# Patient Record
Sex: Female | Born: 1980 | Race: White | Hispanic: No | Marital: Single | Smoking: Never smoker
Health system: Southern US, Community
[De-identification: ages and names within clinical notes are randomized; demographics above are authoritative.]

## PROBLEM LIST (undated history)

## (undated) DIAGNOSIS — J45909 Unspecified asthma, uncomplicated: Secondary | ICD-10-CM

## (undated) DIAGNOSIS — K219 Gastro-esophageal reflux disease without esophagitis: Secondary | ICD-10-CM

## (undated) DIAGNOSIS — M249 Joint derangement, unspecified: Secondary | ICD-10-CM

## (undated) DIAGNOSIS — G43829 Menstrual migraine, not intractable, without status migrainosus: Secondary | ICD-10-CM

## (undated) HISTORY — DX: Unspecified asthma, uncomplicated: J45.909

## (undated) HISTORY — DX: Gastro-esophageal reflux disease without esophagitis: K21.9

## (undated) HISTORY — PX: MOLE REMOVAL: SHX2046

## (undated) HISTORY — DX: Menstrual migraine, not intractable, without status migrainosus: G43.829

## (undated) HISTORY — DX: Joint derangement, unspecified: M24.9

---
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
## 2003-01-09 ENCOUNTER — Emergency Department (HOSPITAL_COMMUNITY): Admission: EM | Admit: 2003-01-09 | Discharge: 2003-01-09 | Payer: Self-pay | Admitting: Emergency Medicine

## 2004-12-05 ENCOUNTER — Other Ambulatory Visit: Admission: RE | Admit: 2004-12-05 | Discharge: 2004-12-05 | Payer: Self-pay | Admitting: Obstetrics and Gynecology

## 2004-12-11 ENCOUNTER — Encounter: Admission: RE | Admit: 2004-12-11 | Discharge: 2004-12-11 | Payer: Self-pay | Admitting: Allergy and Immunology

## 2005-03-07 ENCOUNTER — Ambulatory Visit: Payer: Self-pay | Admitting: Infectious Diseases

## 2006-01-08 ENCOUNTER — Other Ambulatory Visit: Admission: RE | Admit: 2006-01-08 | Discharge: 2006-01-08 | Payer: Self-pay | Admitting: Obstetrics and Gynecology

## 2007-02-05 DIAGNOSIS — J45909 Unspecified asthma, uncomplicated: Secondary | ICD-10-CM

## 2007-02-05 HISTORY — DX: Unspecified asthma, uncomplicated: J45.909

## 2007-02-17 ENCOUNTER — Other Ambulatory Visit: Admission: RE | Admit: 2007-02-17 | Discharge: 2007-02-17 | Payer: Self-pay | Admitting: Obstetrics & Gynecology

## 2008-01-12 ENCOUNTER — Other Ambulatory Visit: Admission: RE | Admit: 2008-01-12 | Discharge: 2008-01-12 | Payer: Self-pay | Admitting: Obstetrics and Gynecology

## 2009-01-13 ENCOUNTER — Other Ambulatory Visit: Admission: RE | Admit: 2009-01-13 | Discharge: 2009-01-13 | Payer: Self-pay | Admitting: Obstetrics & Gynecology

## 2011-06-27 ENCOUNTER — Ambulatory Visit (INDEPENDENT_AMBULATORY_CARE_PROVIDER_SITE_OTHER): Payer: 59 | Admitting: Sports Medicine

## 2011-06-27 ENCOUNTER — Encounter: Payer: Self-pay | Admitting: Sports Medicine

## 2011-06-27 VITALS — BP 122/81 | HR 84 | Ht 64.0 in | Wt 180.0 lb

## 2011-06-27 DIAGNOSIS — M6530 Trigger finger, unspecified finger: Secondary | ICD-10-CM

## 2011-06-27 DIAGNOSIS — M25539 Pain in unspecified wrist: Secondary | ICD-10-CM

## 2011-06-27 DIAGNOSIS — M357 Hypermobility syndrome: Secondary | ICD-10-CM

## 2011-06-27 DIAGNOSIS — M25531 Pain in right wrist: Secondary | ICD-10-CM | POA: Insufficient documentation

## 2011-06-27 MED ORDER — KETOPROFEN POWD: Status: DC

## 2011-06-27 NOTE — Assessment & Plan Note (Signed)
We will use a wrist loop for all her computer activities over the next 6 weeks  Use topical ketoprofen gel for inflammation  Wrist rehabilitation exercises given

## 2011-06-27 NOTE — Assessment & Plan Note (Signed)
Continue to use warm baths and squeezing a rubber ball to keep motion

## 2011-06-27 NOTE — Assessment & Plan Note (Signed)
I think this is the key factor that caused her to have injuries to her right upper extremity into her ankles  Work on strength activities with light weights to help stabilize the joint

## 2011-06-27 NOTE — Progress Notes (Signed)
  Subjective:    Patient ID: Toni Monroe, female    DOB: 05-06-1981, 30 y.o.   MRN: [id number]  HPI  Pt presents to clinic for evaluation of mid anterior right wrist and lateral elbow pain which started 02/2011. 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  Only has pain during work week, none on weekends. Wrist pain is the key symptom that bothers her most.  She also gets a trigger finger on the right third.  Training for half marathon now - last year had 3rd deg ankle sprain while training. Uses kinesio tape on rt wrist and elbow- which helps pain. Takes Advil prn which helps.    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, bilat knees Brighten score 5  No pain with book test Wrist motion on rt increased radial and ulnar deviation, not on lt Wrist extension bilat 90 deg, flexion 80 deg on rt, and 85 deg on lt Phalen's test neg Tenderness over rt lat epicondyle Resisted wrist extension causes pain in rt elbow Slight pain with extension of rt 3rd finger Rt shoulder is hypermobile- able to touch both hands behind back Surgical scar along anterior wrist wrist approx 2 inches long  [facility] ultrasound Right lateral epicondyle is visualized and appears normal Right wrist ischemia and in the fourth compartment reveals edema Other compartments of the wrist are normal Median nerve normal Right third finger reveals thickening of the flexor tendon sheath but no nodule     Assessment & Plan:

## 2011-06-27 NOTE — Patient Instructions (Signed)
Use wrist loop for typing and exercise for next 6 weeks  Start wrist exercises - Downward curl- down slow then up fast Over slow back fast (use light weight dumbbell)   Tricep extensions and bicep curls for elbow   Internal and external rotation and spokes of a wheel exercises for shoulder (do not go behind you with spokes of wheel)  Please follow in in 6 weeks

## 2011-08-12 ENCOUNTER — Encounter: Payer: Self-pay | Admitting: Sports Medicine

## 2011-08-12 ENCOUNTER — Ambulatory Visit (INDEPENDENT_AMBULATORY_CARE_PROVIDER_SITE_OTHER): Payer: 59 | Admitting: Sports Medicine

## 2011-08-12 DIAGNOSIS — M25539 Pain in unspecified wrist: Secondary | ICD-10-CM

## 2011-08-12 DIAGNOSIS — M357 Hypermobility syndrome: Secondary | ICD-10-CM

## 2011-08-12 DIAGNOSIS — M25531 Pain in right wrist: Secondary | ICD-10-CM

## 2011-08-12 DIAGNOSIS — M6530 Trigger finger, unspecified finger: Secondary | ICD-10-CM

## 2011-08-12 NOTE — Assessment & Plan Note (Signed)
Much improved Will continue w exercises Prn use of loop and ketoprofen

## 2011-08-12 NOTE — Patient Instructions (Addendum)
Ok to use ketoprofen gel and wrist loop brace if wrist is bothering you  Continue working with your trainer - keeping joints in a protected position  Follow up as needed   Have a great time in [place]!

## 2011-08-12 NOTE — Assessment & Plan Note (Signed)
Work with Systems analyst on safe positions for exercise  No hyperextension of joints

## 2011-08-12 NOTE — Assessment & Plan Note (Signed)
Cont motion and warm water soaks follow

## 2011-08-12 NOTE — Progress Notes (Signed)
  Subjective:    Patient ID: Toni Monroe, female    DOB: 1981-05-12, 30 y.o.   MRN: [id number]  HPI  Pt presents to clinic for f/u of rt wrist pain which she reports is 90% improved.   She has been using ketoprofen regularly.  Reports getting poison oak since last visit, and was not able to use wrist loop for a month. She also worked with her Systems analyst on upper body strengthening.  Changes positions when she has discomfort with typing at work, which is helpful.  Rt 3rd trigger finger has resolved.   Continues with wrist and hand exercises as described  Review of Systems     Objective:   Physical Exam  Beighton score of 5 Empty can negative bilat Slight discomfort with hawkins on the rt  Neer's negative on rt  No scapular winging bilat No swelling or tenderness of rt wrist No triggering or nodule rt 3rd finger      Assessment & Plan:

## 2011-09-16 ENCOUNTER — Other Ambulatory Visit: Payer: Self-pay | Admitting: *Deleted

## 2011-09-16 MED ORDER — DICLOFENAC SODIUM 1 % TD GEL: TRANSDERMAL | Status: DC

## 2012-01-20 LAB — HM PAP SMEAR: HM Pap smear: NEGATIVE

## 2012-10-09 ENCOUNTER — Ambulatory Visit (INDEPENDENT_AMBULATORY_CARE_PROVIDER_SITE_OTHER): Payer: 59 | Admitting: Sports Medicine

## 2012-10-09 VITALS — BP 132/89 | Ht 64.0 in | Wt 182.0 lb

## 2012-10-09 DIAGNOSIS — M25371 Other instability, right ankle: Secondary | ICD-10-CM | POA: Insufficient documentation

## 2012-10-09 DIAGNOSIS — M24873 Other specific joint derangements of unspecified ankle, not elsewhere classified: Secondary | ICD-10-CM

## 2012-10-09 DIAGNOSIS — M24876 Other specific joint derangements of unspecified foot, not elsewhere classified: Secondary | ICD-10-CM

## 2012-10-09 NOTE — Progress Notes (Signed)
Chief complaint right ankle pain calf pain  History of present illness: Patient is a 32 year old female who is coming in with complaint of right ankle and calf pain. Patient has a past medical history significant for multiple ankle sprains of the right ankle. Patient states that she discovered or any new injury but with her training recently she's been having increasing pain. Patient has been using kiniseotape which has been somewhat beneficial. Patient has not been wearing any ankle braces. Patient has not been taking any medications. Patient denies that this is stop her from any of her regular activities but anytime she tries to increase her training regimen she starts having more pain. Patient has been working with a Systems analyst for ankle stability which has helped but at the moment she has to back off secondary to the pain.  Patient is known history of hypermobility syndrome.  No changes in past medical, surgical, and family history.  Review of systems: 14 system review is done and unremarkable as related to the chief complaint.  Physical exam Blood pressure 132/89, height 5\' 4"  (1.626 m), weight 182 lb (82.555 kg). General: No apparent distress alert and oriented x3 mood and affect somewhat normal but blunted. Respiratory: Patient's speak in full sentences and does not appear short of breath Skin: Warm dry intact with no signs of infection or rash Neuro: Cranial nerves II through XII are intact, neurovascularly intact in all extremities with 2+ DTRs and 2+ pulses. Right ankle exam: Patient does have a mild positive anterior translation. She does have mild effusion over the ATFL. She is nontender on exam. Achilles is nontender on exam. Patient has full range of motion of the ankle actively in 5 out of 5 strength. Patient's calf is soft nontender with no erythema. Patient does have kinesiology tape in place. On standing patient does have overpronation of the hindfoot and splaying of the  first and second toes bilaterally. Patient has breakdown of the transverse arch bilaterally.  Patient was fitted in sports insoles with a medial heel wedge bilaterally as well as metatarsal pad bilaterally. Patient did walk in more neutral position.

## 2012-10-09 NOTE — Assessment & Plan Note (Signed)
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 Patient was given some temporary sports insoles with corrections as noted above. This likely will improve over the course of time. Patient was also given a ankle brace/compression sleeve which I think will help her with her training. Patient was given exercises that she can implement into her training. In addition to this patient was given hip abductor and core strengthening exercises as well. She will return again in 4 weeks for further evaluation.

## 2013-01-19 ENCOUNTER — Encounter: Payer: Self-pay | Admitting: *Deleted

## 2013-01-20 ENCOUNTER — Encounter: Payer: Self-pay | Admitting: Certified Nurse Midwife

## 2013-01-20 ENCOUNTER — Ambulatory Visit (INDEPENDENT_AMBULATORY_CARE_PROVIDER_SITE_OTHER): Payer: 59 | Admitting: Certified Nurse Midwife

## 2013-01-20 VITALS — BP 110/70 | Ht 64.25 in | Wt 191.0 lb

## 2013-01-20 DIAGNOSIS — Z01419 Encounter for gynecological examination (general) (routine) without abnormal findings: Secondary | ICD-10-CM

## 2013-01-20 MED ORDER — NORETHIN ACE-ETH ESTRAD-FE 1-20 MG-MCG PO TABS: 1.0000 | ORAL_TABLET | Freq: Every day | ORAL | Status: DC

## 2013-01-20 NOTE — Patient Instructions (Signed)

## 2013-01-20 NOTE — Progress Notes (Signed)
32 y.o. Single Caucasian female   G0P0000 here for annual exam.  Periods normal no issues.  Recent removal of two moles, all atypical but no cancer.  Asthma better control with running as exercise.  Contraception: Junel 1/20 Fe  working well with continous use with cycle every 3 months. Working well for menstrual migraines. No health issues today.  Patient's last menstrual period was 10/07/2012.          Sexually active: no  The current method of family planning is OCP (estrogen/progesterone).    Exercising: yes  running & weights Last mammogram: none Last pap: 01-20-12 neg Last BMD: none Alcohol: 7 a week Tobacco: none Colonoscopy: none  Health Maintenance  Topic Date Due  . Tetanus/tdap  08/26/2000  . Influenza Vaccine  06/07/2013  . Pap Smear  01/20/2015    Family History  Problem Relation Age of Onset  . Cancer Mother     lung  . Hyperlipidemia Mother   . Hypertension Father   . Glaucoma Father   . Cancer Maternal Grandmother     lymphatic  . Glaucoma Paternal Grandmother   . Hypertension Paternal Grandmother   . Diabetes Paternal Grandfather   . Hypertension Paternal Grandfather   . Heart disease Paternal Grandfather     heart attack  . Cancer Paternal Grandfather     melanoma  . Heart disease Maternal Aunt     heart attack under age 8    Patient Active Problem List  Diagnosis  . Wrist pain, right  . Right trigger finger  . Hypermobility syndrome  . Right ankle instability    Past Medical History  Diagnosis Date  . GERD (gastroesophageal reflux disease)   . Asthma 02/2007    allergys  . Menstrual migraine     and aura    Past Surgical History  Procedure Laterality Date  . Birth mark removal  1986  . Mole removal      Allergies: Ketoprofen  Current Outpatient Prescriptions  Medication Sig Dispense Refill  . cetirizine (ZYRTEC) 10 MG tablet Take 10 mg by mouth daily.        Marland Kitchen ibuprofen (ADVIL,MOTRIN) 100 MG tablet Take 100 mg by mouth as needed  for fever.      . Multiple Vitamin (MULTIVITAMIN) tablet Take 1 tablet by mouth daily.        . Norethin Ace-Eth Estrad-FE (LOESTRIN FE 1/20 PO) Take by mouth daily. Takes continuously      . SINGULAIR 10 MG tablet Take 10 mg by mouth daily.      . VENTOLIN HFA 108 (90 BASE) MCG/ACT inhaler Inhale 2 puffs into the lungs as needed.       No current facility-administered medications for this visit.    ROS: A comprehensive review of systems was negative.  Exam:    BP 110/70  Ht 5' 4.25" (1.632 m)  Wt 191 lb (86.637 kg)  BMI 32.53 kg/m2  LMP 10/07/2012 Weight change: @WEIGHTCHANGE @ Last 3 height recordings:  Ht Readings from Last 3 Encounters:  01/20/13 5' 4.25" (1.632 m)  10/09/12 5\' 4"  (1.626 m)  06/27/11 5\' 4"  (1.626 m)   General appearance: alert and cooperative Head: Normocephalic, without obvious abnormality, atraumatic Neck: no adenopathy, supple, symmetrical, trachea midline and thyroid not enlarged, symmetric, no tenderness/mass/nodules Lungs: clear to auscultation bilaterally Breasts: normal appearance, no masses or tenderness, No nipple retraction or dimpling Heart: regular rate and rhythm Abdomen: soft, non-tender; bowel sounds normal; no masses,  no organomegaly Extremities: extremities  normal, atraumatic, no cyanosis or edema Skin: Skin color, texture, turgor normal. No rashes or lesions Lymph nodes: Cervical, supraclavicular, and axillary nodes normal. no inguinal nodes palpated Neurologic: Alert and oriented X 3, normal strength and tone. Normal symmetric reflexes. Normal coordination and gait   Pelvic: External genitalia:  no lesions              Urethra: normal appearing urethra with no masses, tenderness or lesions              Bartholins and Skenes: Bartholin's, Urethra, Skene's normal                 Vagina: normal appearing vagina with normal color and discharge, no lesions              Cervix: normal appearance              Pap taken: no        Bimanual  Exam:  Uterus:  uterus is normal size, shape, consistency and nontender                                      Adnexa:    normal adnexa in size, nontender and no masses                                      Rectovaginal: Confirms                                      Anus:  normal sphincter tone, no lesions  A:Well woman exam Contraception: Junel 1/20Fe continuous use for menstrual migraine control Asthma well controlled PCP manages     P:Reveiwed health and wellness pertinent to exam  pap smear as indicated Rx Junel1/20 Fe see order Continue follow up as indicated  return annually or prn      An After Visit Summary was printed and given to the patient.  Reviewed, TL

## 2013-01-26 ENCOUNTER — Ambulatory Visit: Payer: 59 | Admitting: Certified Nurse Midwife

## 2013-11-05 ENCOUNTER — Ambulatory Visit (INDEPENDENT_AMBULATORY_CARE_PROVIDER_SITE_OTHER): Payer: 59 | Admitting: Sports Medicine

## 2013-11-05 ENCOUNTER — Encounter: Payer: Self-pay | Admitting: Sports Medicine

## 2013-11-05 VITALS — BP 138/90 | Ht 64.0 in | Wt 190.0 lb

## 2013-11-05 DIAGNOSIS — M2170 Unequal limb length (acquired), unspecified site: Secondary | ICD-10-CM

## 2013-11-05 DIAGNOSIS — M549 Dorsalgia, unspecified: Secondary | ICD-10-CM | POA: Insufficient documentation

## 2013-11-05 NOTE — Progress Notes (Signed)
Patient ID: Toni Monroe, female   DOB: [birth year], 33 y.o.   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 Onset approximately one year ago. Pain is intermittent, it is exacerbated by running. She acute onset of pain approximately 8 miles into her runs.  Pain gets severe enough that it decreases to run to walk. The pain is also exacerbated by certain weight lifting regimens. She denies any specific injury. She does a remote history of ankle sprain on the right as well as hypermobility syndrome. She wears supportive inserts in her running shoes and complains of no foot pain currently. She has no knee pain or hip pain.  Pertinent past medical history hypermobility syndrome and right ankle instability  Social history: Occasional alcohol nonsmoker  Review of systems as per history of present illness otherwise negative  Examination: BP 138/90  Ht 5\' 4"  (1.626 m)  Wt 190 lb (86.183 kg)  BMI 32.60 kg/m2 Well-developed well-nourished 33 year old white female awake alert and oriented in no acute distress  Back tenderness to palpation noted in the right mid lumbar paraspinous musculature region. No SI joint tenderness or instability.  No vertebral point tenderness  Hips: Hip abduction strength 5/5, 5/5 for flexion extension and adduction as well  Leg length: 1 cm shorter left than right  Gait: She is slight outward bowing of the right foot. In addition she has a slight Trendelenburg of the left hip.

## 2013-11-05 NOTE — Assessment & Plan Note (Signed)
New inserts are made today. 3/16 heel lift was added to the left leg to compensate for leg length discrepancy.

## 2013-11-05 NOTE — Assessment & Plan Note (Addendum)
Patient given a prescription followup with Toni SiaJohn O'Halloran for formal physical therapy.  In addition compensation for leg length discrepancy may help improve her low back pain. She was also given line drills to do to help improve her running forming gait. She'll followup in one month.

## 2013-12-22 ENCOUNTER — Ambulatory Visit (INDEPENDENT_AMBULATORY_CARE_PROVIDER_SITE_OTHER): Payer: 59 | Admitting: Sports Medicine

## 2013-12-22 ENCOUNTER — Encounter: Payer: Self-pay | Admitting: Sports Medicine

## 2013-12-22 VITALS — BP 134/87 | Ht 64.0 in | Wt 190.0 lb

## 2013-12-22 DIAGNOSIS — M549 Dorsalgia, unspecified: Secondary | ICD-10-CM

## 2013-12-22 NOTE — Progress Notes (Addendum)
   Subjective:    Patient ID: Toni Monroe, female    DOB: 1981-01-05, 33 y.o.   MRN: [id number]  HPI  Ms. Bradstreet is here for follow-up on lower back pain which occurs primarily with running long distances. Since her last appointment she has been to PT with Jonny RuizJohn O'Halloran 2-3 times. She was given exercises to do to strengthen her back and improve right sided stability. She has been doing those exercises at home. She also started working with a Systems analystpersonal trainer who has provided her with more back strengthening work-outs. She has only had one run of 6 miles since her last appointment and did not experience any back pain. When she works out with the Systems analystpersonal trainer on her back she experiences a mild form of the back pain which is improved with 800 MG of Ibuprofen.   She has also been working on improving her running gait. And was given a heel lift during her last appointment. She has noticed that she is getting right-sided calf pain during long runs. She wears calf sleeves.   Denies tingling, numbness, or pain shooting down her leg.    Review of Systems Negative apart from HPI     Objective:   Physical Exam  Back Inspection: symmetric Palpation: no tenderness to palpation along vertebral column or Paraspinous muscles ROM: Full flexion and extension without back pain No tenderness to palpation along either calf. No calf swelling.  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 Low Back pain: improved --Continue to see John O'Halloran prn and continue to do PT exercises --Continue to work on corrected running gait --Follow-up PRN   Seen with Howell RucksJessica Rein, MS4

## 2014-01-04 ENCOUNTER — Other Ambulatory Visit: Payer: Self-pay | Admitting: Certified Nurse Midwife

## 2014-01-04 NOTE — Telephone Encounter (Signed)
AEX scheduled for 02/02/15 with Ms. [name]  Last AEX 01/19/13 rf's x 1 year was sent to pharmacy Junel 1/20 #1 pack with 0 refills sent to last patient until AEX

## 2014-01-28 ENCOUNTER — Telehealth: Payer: Self-pay | Admitting: Certified Nurse Midwife

## 2014-01-28 NOTE — Telephone Encounter (Signed)
Pt reshceduled

## 2014-01-28 NOTE — Telephone Encounter (Signed)
lmtcb re cx'd AEX with DL for 01/14/80--XBJYNW4/28/15--please RS patient.

## 2014-02-01 ENCOUNTER — Ambulatory Visit: Payer: 59 | Admitting: Certified Nurse Midwife

## 2014-02-07 ENCOUNTER — Other Ambulatory Visit: Payer: Self-pay | Admitting: Certified Nurse Midwife

## 2014-02-07 NOTE — Telephone Encounter (Signed)
eScribe request from [facility] for refill on JUNEL FE 1/20 Last filled - 01/04/14 X 1 PACK Last AEX - 01/20/13 Next AEX - 03/18/14 2 packs sent to last until AEX.

## 2014-03-18 ENCOUNTER — Encounter: Payer: Self-pay | Admitting: Certified Nurse Midwife

## 2014-03-18 ENCOUNTER — Ambulatory Visit (INDEPENDENT_AMBULATORY_CARE_PROVIDER_SITE_OTHER): Payer: 59 | Admitting: Certified Nurse Midwife

## 2014-03-18 VITALS — BP 120/84 | HR 70 | Resp 16 | Ht 64.25 in | Wt 201.0 lb

## 2014-03-18 DIAGNOSIS — Z124 Encounter for screening for malignant neoplasm of cervix: Secondary | ICD-10-CM

## 2014-03-18 DIAGNOSIS — Z01419 Encounter for gynecological examination (general) (routine) without abnormal findings: Secondary | ICD-10-CM

## 2014-03-18 DIAGNOSIS — Z309 Encounter for contraceptive management, unspecified: Secondary | ICD-10-CM

## 2014-03-18 MED ORDER — NORETHINDRONE ACET-ETHINYL EST 1-20 MG-MCG PO TABS: 1.0000 | ORAL_TABLET | Freq: Every day | ORAL | Status: DC

## 2014-03-18 NOTE — Patient Instructions (Signed)

## 2014-03-18 NOTE — Progress Notes (Signed)
33 y.o. G0P0000 Single Caucasian Fe here for annual exam. Periods normal, no issues. OCP working well for menstrual migraine with continuous use. [place] Sees PCP for labs and medication management as needed. No health issues today. Planning weight loss with PCP management.  Patient's last menstrual period was 03/07/2014.          Sexually active: no  The current method of family planning is OCP (estrogen/progesterone).    Exercising: yes  cardio & weightlifting Smoker:  no  Health Maintenance: Pap:  01-20-12 neg MMG:  none Colonoscopy: none BMD:   none TDaP:  2008 Labs: none PCP Self breast exam: done occ   reports that she has never smoked. She has never used smokeless tobacco. She reports that she drinks about 4 ounces of alcohol per week. She reports that she does not use illicit drugs.  Past Medical History  Diagnosis Date  . GERD (gastroesophageal reflux disease)   . Asthma 02/2007    allergys  . Menstrual migraine     and aura    Past Surgical History  Procedure Laterality Date  . Birth mark removal  1986  . Mole removal      Current Outpatient Prescriptions  Medication Sig Dispense Refill  . cetirizine (ZYRTEC) 10 MG tablet Take 10 mg by mouth daily.        Marland Kitchen. EPIPEN 2-PAK 0.3 MG/0.3ML SOAJ injection as needed.      Marland Kitchen. ibuprofen (ADVIL,MOTRIN) 100 MG tablet Take 100 mg by mouth as needed for fever.      Colleen Can. JUNEL FE 1/20 1-20 MG-MCG tablet TAKE ONE TABLET BY MOUTH ONE TIME DAILY   28 tablet  1  . Multiple Vitamin (MULTIVITAMIN) tablet Take 1 tablet by mouth as needed.       Marland Kitchen. SINGULAIR 10 MG tablet Take 10 mg by mouth daily.      . VENTOLIN HFA 108 (90 BASE) MCG/ACT inhaler Inhale 2 puffs into the lungs as needed.       No current facility-administered medications for this visit.    Family History  Problem Relation Age of Onset  . Cancer Mother     lung  . Hyperlipidemia Mother   . Hypertension Father   . Glaucoma Father    . Cancer Maternal Grandmother     lymphatic  . Glaucoma Paternal Grandmother   . Hypertension Paternal Grandmother   . Diabetes Paternal Grandfather   . Hypertension Paternal Grandfather   . Heart disease Paternal Grandfather     heart attack  . Cancer Paternal Grandfather     melanoma  . Dementia Paternal Grandfather   . Heart disease Maternal Aunt     heart attack under age 33    ROS:  Pertinent items are noted in HPI.  Otherwise, a comprehensive ROS was negative.  Exam:   BP 120/84  Pulse 70  Resp 16  Ht 5' 4.25" (1.632 m)  Wt 201 lb (91.173 kg)  BMI 34.23 kg/m2  LMP 03/07/2014  Recheck of BP after sitting for 15 minutes 110/68Height: 5' 4.25" (163.2 cm)  Ht Readings from Last 3 Encounters:  03/18/14 5' 4.25" (1.632 m)  12/22/13 5\' 4"  (1.626 m)  11/05/13 5\' 4"  (1.626 m)    General appearance: alert, cooperative and appears stated age Head: Normocephalic, without obvious abnormality, atraumatic Neck: no adenopathy, supple, symmetrical, trachea midline and thyroid normal to inspection and palpation and non-palpable Lungs: clear to auscultation bilaterally Breasts: normal appearance, no  masses or tenderness, No nipple retraction or dimpling, No nipple discharge or bleeding, No axillary or supraclavicular adenopathy Heart: regular rate and rhythm Abdomen: soft, non-tender; no masses,  no organomegaly Extremities: extremities normal, atraumatic, no cyanosis or edema Skin: Skin color, texture, turgor normal. No rashes or lesions Lymph nodes: Cervical, supraclavicular, and axillary nodes normal. No abnormal inguinal nodes palpated Neurologic: Grossly normal   Pelvic: External genitalia:  no lesions              Urethra:  normal appearing urethra with no masses, tenderness or lesions              Bartholin's and Skene's: normal                 Vagina: normal appearing vagina with normal color and discharge, no lesions              Cervix: normal,non tender               Pap taken: yes Bimanual Exam:  Uterus:  normal size, contour, position, consistency, mobility, non-tender and retroverted              Adnexa: normal adnexa and no mass, fullness, tenderness               Rectovaginal: Confirms               Anus:  normal sphincter tone, no lesions  A:  Well Woman with normal exam  Contraception for cycle control and menstrual migraine OCP working well  Weight gain with training for half marathon  P:   Reviewed health and wellness pertinent to exam  Rx Microgestin(Junel 1/20) see order  Pap smear taken today with HPVHR  Plans working with PCP regarding weight management   counseled on STD prevention, HIV risk factors and prevention, use and side effects of OCP's, adequate intake of calcium and vitamin D, diet and exercise  return annually or prn  An After Visit Summary was printed and given to the patient.

## 2014-03-19 NOTE — Progress Notes (Signed)
Reviewed personally.  M. Suzanne Marae Cottrell, MD.  

## 2014-03-22 LAB — IPS PAP TEST WITH HPV

## 2014-03-23 ENCOUNTER — Telehealth: Payer: Self-pay | Admitting: Certified Nurse Midwife

## 2014-03-23 ENCOUNTER — Other Ambulatory Visit: Payer: Self-pay | Admitting: Certified Nurse Midwife

## 2014-03-23 DIAGNOSIS — Z309 Encounter for contraceptive management, unspecified: Secondary | ICD-10-CM

## 2014-03-23 MED ORDER — NORETHIN ACE-ETH ESTRAD-FE 1-20 MG-MCG PO TABS: 1.0000 | ORAL_TABLET | Freq: Every day | ORAL | Status: DC

## 2014-03-23 NOTE — Telephone Encounter (Signed)
Patient wants to change her birth control due to cost. Patient says her previous birth control was junel. Patient may want to return to this or a new birth control.

## 2014-03-23 NOTE — Telephone Encounter (Signed)
Toni Monroe CNM, patient was seen on 03/18/14 for AEX. Patient was switched from Junel Fe 1-20 to Microgestin 1-20. Patient would like to return to old OCP or new OCP that is cheaper. Patient takes OCP continuously and has a history of menstrual migraines. What do you recommend for patient?

## 2014-03-23 NOTE — Telephone Encounter (Signed)
Ok to switch back, order placed

## 2014-03-23 NOTE — Telephone Encounter (Signed)
Spoke with patient. Advised rx switched back to Junel Fe 1-20 and sent to pharmacy of choice. Patient agreeable and verbalizes understanding.  Routing to provider for final review. Patient agreeable to disposition. Will close encounter

## 2014-07-01 ENCOUNTER — Telehealth: Payer: Self-pay | Admitting: Certified Nurse Midwife

## 2014-07-01 NOTE — Telephone Encounter (Signed)
Pt says she need something for her insurance stating that she was seen for an aex.

## 2014-07-13 DIAGNOSIS — E669 Obesity, unspecified: Secondary | ICD-10-CM | POA: Insufficient documentation

## 2014-07-13 DIAGNOSIS — J3089 Other allergic rhinitis: Secondary | ICD-10-CM | POA: Insufficient documentation

## 2014-09-09 ENCOUNTER — Telehealth: Payer: Self-pay | Admitting: Certified Nurse Midwife

## 2014-09-09 NOTE — Telephone Encounter (Signed)
Spoke with patient. Patient states that when she went in for appointment with PCP was told she may have PCOS. Patient [facility] who she did not [facility] for. Patient would like to come in to see Verner Choleborah S. Leonard CNM to discuss PCOS. Offered appointment for 12/8 but patient declines due to work schedule.Appointment scheduled for 12/15 at 2:30pm. Patient is agreeable to date and time.  Routing to provider for final review. Patient agreeable to disposition. Will close encounter

## 2014-09-09 NOTE — Telephone Encounter (Signed)
Pt saw her GP Dr Ihor [facility]nodi @ [facility] and he told pt she may have signs of PCOS. [facility] who [facility] not [facility] for. She would like to speak to Ms Darcel BayleyLeonard about this.  Pt best: [phone]  bf

## 2014-09-20 ENCOUNTER — Encounter: Payer: Self-pay | Admitting: Certified Nurse Midwife

## 2014-09-20 ENCOUNTER — Ambulatory Visit (INDEPENDENT_AMBULATORY_CARE_PROVIDER_SITE_OTHER): Payer: BC Managed Care – PPO | Admitting: Certified Nurse Midwife

## 2014-09-20 VITALS — BP 146/98 | HR 86 | Ht 64.25 in | Wt 192.0 lb

## 2014-09-20 DIAGNOSIS — R635 Abnormal weight gain: Secondary | ICD-10-CM

## 2014-09-20 NOTE — Patient Instructions (Signed)
Polycystic Ovarian Syndrome  Polycystic ovarian syndrome (PCOS) is a common hormonal disorder among women of reproductive age. Most women with PCOS grow many small cysts on their ovaries. PCOS can cause problems with your periods and make it difficult to get pregnant. It can also cause an increased risk of miscarriage with pregnancy. If left untreated, PCOS can lead to serious health problems, such as diabetes and heart disease.  CAUSES  The cause of PCOS is not fully understood, but genetics may be a factor.  SIGNS AND SYMPTOMS    Infrequent or no menstrual periods.    Inability to get pregnant (infertility) because of not ovulating.    Increased growth of hair on the face, chest, stomach, back, thumbs, thighs, or toes.    Acne, oily skin, or dandruff.    Pelvic pain.    Weight gain or obesity, usually carrying extra weight around the waist.    Type 2 diabetes.    High cholesterol.    High blood pressure.    Female-pattern baldness or thinning hair.    Patches of thickened and dark brown or black skin on the neck, arms, breasts, or thighs.    Tiny excess flaps of skin (skin tags) in the armpits or neck area.    Excessive snoring and having breathing stop at times while asleep (sleep apnea).    Deepening of the voice.    Gestational diabetes when pregnant.   DIAGNOSIS   There is no single test to diagnose PCOS.    Your health care provider will:    Take a medical history.    Perform a pelvic exam.    Have ultrasonography done.    Check your female and female hormone levels.    Measure glucose or sugar levels in the blood.    Do other blood tests.    If you are producing too many female hormones, your health care provider will make sure it is from PCOS. At the physical exam, your health care provider will want to evaluate the areas of increased hair growth. Try to allow natural hair growth for a few days before the visit.    During a pelvic exam, the ovaries may be  enlarged or swollen because of the increased number of small cysts. This can be seen more easily by using vaginal ultrasonography or screening to examine the ovaries and lining of the uterus (endometrium) for cysts. The uterine lining may become thicker if you have not been having a regular period.   TREATMENT   Because there is no cure for PCOS, it needs to be managed to prevent problems. Treatments are based on your symptoms. Treatment is also based on whether you want to have a baby or whether you need contraception.   Treatment may include:    Progesterone hormone to start a menstrual period.    Birth control pills to make you have regular menstrual periods.    Medicines to make you ovulate, if you want to get pregnant.    Medicines to control your insulin.    Medicine to control your blood pressure.    Medicine and diet to control your high cholesterol and triglycerides in your blood.   Medicine to reduce excessive hair growth.   Surgery, making small holes in the ovary, to decrease the amount of female hormone production. This is done through a long, lighted tube (laparoscope) placed into the pelvis through a tiny incision in the lower abdomen.   HOME CARE INSTRUCTIONS   Only   take over-the-counter or prescription medicine as directed by your health care provider.   Pay attention to the foods you eat and your activity levels. This can help reduce the effects of PCOS.   Keep your weight under control.   Eat foods that are low in carbohydrate and high in fiber.   Exercise regularly.  SEEK MEDICAL CARE IF:   Your symptoms do not get better with medicine.   You have new symptoms.  Document Released: 01/17/2005 Document Revised: 07/14/2013 Document Reviewed: 03/11/2013  ExitCare Patient Information 2015 [employer]. This information is not intended to replace advice given to you by your health care provider. Make sure you discuss any questions you have with your health care provider.

## 2014-09-20 NOTE — Progress Notes (Signed)
33 y.o. Single Caucasian G0P0000here for evaluation of PCOS. Menses normal with continuous use Junel 1/20 fe with period every third cycle.LMP 10/15. Patient was seen by endocrine at [facility] and was told she does not have PCOS. Was given Phentermine for use with 12 pounds loss. She is working with Research scientist (life sciences)trainer and nutritionist. She feels this is helping. PCP thought she had PCOS due to weight gain. Denies excessive hair growth, irregular periods or problems with periods. Had Hgb A1-c which was 4.2,did not do TSH. She has cut down on alcohol intake, also. Would like to have thyroid level checked and testosterone level. She is aware other hormone testing can not be done while on active pills. " Here to discuss what our opinion is."  O: Healthy female, WD WN Affect: normal orientation X 3  BP  Recheck 120/70   A: History of weight gain and weight loss with good physical activity and diet over the past 6-7 years Recent [facility] and was told no PCOS, just weight problem. Phentermine use with weight loss. No specific character pattern for PCOS, but no PUS  P: Discussed patient her concerns with weight management. Patient works for the [employer] and has access to all the help she needs with nutrition and exercise. Discouraged that it takes so much work to lose weight and maintain loss. Had been running in 5 k and injured ankle last year and has been unable to do this, which did help with weight control. Discussed that PCOS does have weight, glucose issues usually, excessive hair growth, irregular cycles or amenorrhea. She does not have any of these, except for weight issues. Feel PUS not warranted at this time. Discussed not focusing on weight but healthy lifestyle and diet. Also discussed that nightly alcohol (1-2 drinks) increase calorie load, but no nutritional value. Patient feels better after discussion and questions answered. Will continue in current direction with life style changes.  Questions addressed. Patient will advise of progress.  Lab: TSH with  panel, Testosterone   35 minutes spent with patient  in face to face counseling regarding PCOS, weight management and healthy lifestyle.  RV prn

## 2014-09-21 LAB — THYROID PANEL WITH TSH
Free Thyroxine Index: 1.6 (ref 1.4–3.8)
T3 Uptake: 19 % — ABNORMAL LOW (ref 22–35)
T4, Total: 8.2 ug/dL (ref 4.5–12.0)
TSH: 2.79 u[IU]/mL (ref 0.350–4.500)

## 2014-09-21 LAB — TESTOSTERONE: TESTOSTERONE: 30 ng/dL (ref 10–70)

## 2014-09-23 NOTE — Progress Notes (Signed)
Reviewed personally.  M. Suzanne Ahilyn Nell, MD.  

## 2014-09-26 ENCOUNTER — Telehealth: Payer: Self-pay

## 2014-09-26 ENCOUNTER — Other Ambulatory Visit: Payer: Self-pay | Admitting: Certified Nurse Midwife

## 2014-09-26 DIAGNOSIS — R899 Unspecified abnormal finding in specimens from other organs, systems and tissues: Secondary | ICD-10-CM

## 2014-09-26 NOTE — Telephone Encounter (Signed)
-----   Message from Verner Choleborah S Leonard, CNM sent at 09/26/2014  8:47 AM EST ----- Notify patient that testosterone is normal Thyroid panel is normal except for slightly low T 3 no treatment indicated Recheck in 6 months order in

## 2014-09-26 NOTE — Telephone Encounter (Signed)
Lmtcb.

## 2014-09-27 NOTE — Telephone Encounter (Signed)
Patient notified of results as written by provider 

## 2014-09-27 NOTE — Telephone Encounter (Signed)
Pt returning call

## 2014-10-04 ENCOUNTER — Other Ambulatory Visit: Payer: Self-pay | Admitting: Certified Nurse Midwife

## 2014-10-06 ENCOUNTER — Other Ambulatory Visit: Payer: Self-pay | Admitting: Certified Nurse Midwife

## 2014-10-21 ENCOUNTER — Other Ambulatory Visit (INDEPENDENT_AMBULATORY_CARE_PROVIDER_SITE_OTHER): Payer: BLUE CROSS/BLUE SHIELD

## 2014-10-21 ENCOUNTER — Other Ambulatory Visit: Payer: Self-pay | Admitting: Certified Nurse Midwife

## 2014-10-21 DIAGNOSIS — R635 Abnormal weight gain: Secondary | ICD-10-CM

## 2014-10-21 DIAGNOSIS — R899 Unspecified abnormal finding in specimens from other organs, systems and tissues: Secondary | ICD-10-CM

## 2014-10-22 LAB — FSH/LH
FSH: 5.5 m[IU]/mL
LH: 0.9 m[IU]/mL

## 2014-10-22 LAB — ESTRADIOL: Estradiol: 27.3 pg/mL

## 2014-10-22 LAB — INSULIN, FASTING: INSULIN FASTING, SERUM: 6.2 u[IU]/mL (ref 2.0–19.6)

## 2014-10-22 LAB — THYROID PANEL WITH TSH
Free Thyroxine Index: 2 (ref 1.4–3.8)
T3 UPTAKE: 23 % (ref 22–35)
T4, Total: 8.6 ug/dL (ref 4.5–12.0)
TSH: 2.164 u[IU]/mL (ref 0.350–4.500)

## 2014-10-22 LAB — DHEA-SULFATE: DHEA SO4: 60 ug/dL (ref 23–266)

## 2014-10-25 LAB — 17-HYDROXYPROGESTERONE: 17-OH-PROGESTERONE, LC/MS/MS: 21 ng/dL

## 2015-02-06 ENCOUNTER — Other Ambulatory Visit: Payer: Self-pay | Admitting: Certified Nurse Midwife

## 2015-02-06 NOTE — Telephone Encounter (Signed)
Medication refill request: Junel  Last AEX:  03/18/14 DL Next AEX: 1/4/789/9/16 DL Last MMG (if hormonal medication request): none Refill authorized: 03/23/14 #28/ 13 Refills. Today #28tabs/ 4 R?  [facility]

## 2015-03-01 ENCOUNTER — Encounter: Payer: Self-pay | Admitting: Sports Medicine

## 2015-03-01 ENCOUNTER — Ambulatory Visit (INDEPENDENT_AMBULATORY_CARE_PROVIDER_SITE_OTHER): Payer: BLUE CROSS/BLUE SHIELD | Admitting: Sports Medicine

## 2015-03-01 VITALS — BP 134/84 | HR 78 | Ht 64.0 in | Wt 191.0 lb

## 2015-03-01 DIAGNOSIS — M25512 Pain in left shoulder: Secondary | ICD-10-CM

## 2015-03-01 MED ORDER — IBUPROFEN 800 MG PO TABS: ORAL_TABLET | ORAL | Status: DC

## 2015-03-01 NOTE — Progress Notes (Signed)
   Subjective:    Patient ID: Beatris SiElizabeth J Horsey, female    DOB: [birth year], 34 y.o.   MRN: [id number]  HPI chief complaint: Left shoulder pain  Very pleasant 34 year old right-hand-dominant female comes in today complaining of 2 days of left shoulder pain. No trauma that she can recall but she does admit that she was carrying a rather heavy carry[place]on bag through the airport this [place]. She is describing a diffuse ache throughout the shoulder which is present with overhead reaching or reaching away from her body. No numbness or tingling. She denies problems with her shoulder in the past. Some pain while sleeping. She has not noticed any decreased range of motion. She has been taking some over-the-counter Advil with some symptom relief but not complete symptom resolution. She has done well in the past with prescription strength ibuprofen for other musculoskeletal injuries. No prior shoulder surgery.  Past medical history reviewed. History is significant for hypermobility syndrome Medications reviewed Allergies reviewed    Review of Systems     Objective:   Physical Exam Well-developed, well-nourished. No acute distress  Left shoulder: Patient demonstrates full range of motion with a positive painful arc. No tenderness to palpation over the bicipital groove. Rotator cuff strength is 5/5 but reproducible pain with resisted supraspinatus. Positive empty can, positive Hawkins. Negative apprehension. Positive sulcus. Neurovascular intact distally       Assessment & Plan:  Left shoulder pain secondary to subacromial bursitis/rotator cuff tendinitis Hypermobility syndrome  Patient will start 800 mg of ibuprofen 3 times daily for the next 4 days then as needed. I have educated her in a Jobe home exercise program but she understands that I do not want her to perform these exercises past the normal ranges of motion (90 of internal rotation, 90 of external rotation, 180 of forward  flexion and abduction). If symptoms persist patient will return to the office for consideration of a diagnostic ultrasound and possibly a subacromial cortisone injection. Otherwise, follow-up as needed.

## 2015-05-30 ENCOUNTER — Other Ambulatory Visit: Payer: Self-pay | Admitting: Nurse Practitioner

## 2015-05-30 NOTE — Telephone Encounter (Signed)
Medication refill request: Junel 1/20 Last AEX:  03/18/14 with DL Next AEX: 10/12/08 with DL  Last MMG (if hormonal medication request): n/a Refill authorized: #28

## 2015-06-14 DIAGNOSIS — Z91038 Other insect allergy status: Secondary | ICD-10-CM | POA: Insufficient documentation

## 2015-06-14 DIAGNOSIS — H1010 Acute atopic conjunctivitis, unspecified eye: Secondary | ICD-10-CM

## 2015-06-14 DIAGNOSIS — J309 Allergic rhinitis, unspecified: Secondary | ICD-10-CM | POA: Insufficient documentation

## 2015-06-14 DIAGNOSIS — J4520 Mild intermittent asthma, uncomplicated: Secondary | ICD-10-CM | POA: Insufficient documentation

## 2015-06-14 DIAGNOSIS — Z91030 Bee allergy status: Secondary | ICD-10-CM | POA: Insufficient documentation

## 2015-06-16 ENCOUNTER — Encounter: Payer: Self-pay | Admitting: Certified Nurse Midwife

## 2015-06-16 ENCOUNTER — Ambulatory Visit (INDEPENDENT_AMBULATORY_CARE_PROVIDER_SITE_OTHER): Payer: BLUE CROSS/BLUE SHIELD | Admitting: Certified Nurse Midwife

## 2015-06-16 VITALS — BP 112/78 | HR 70 | Resp 16 | Ht 64.5 in | Wt 198.0 lb

## 2015-06-16 DIAGNOSIS — Z3041 Encounter for surveillance of contraceptive pills: Secondary | ICD-10-CM | POA: Diagnosis not present

## 2015-06-16 DIAGNOSIS — Z01419 Encounter for gynecological examination (general) (routine) without abnormal findings: Secondary | ICD-10-CM

## 2015-06-16 MED ORDER — NORETHIN ACE-ETH ESTRAD-FE 1-20 MG-MCG PO TABS: 1.0000 | ORAL_TABLET | Freq: Every day | ORAL | Status: DC

## 2015-06-16 NOTE — Patient Instructions (Signed)

## 2015-06-16 NOTE — Progress Notes (Signed)
34 y.o. G0P0000 Single  Caucasian Fe here for annual exam. Periods normal,no issues. Really happy with every 3 month period with less migraine only with menses and less. Sees Allergist for medication management. Sees [facility] for aex/labs. Getting ready for marathon! No health issues today.  Patient's last menstrual period was 06/06/2015.          Sexually active: No.  The current method of family planning is OCP (estrogen/progesterone).    Exercising: Yes.    run & lift weights Smoker:  no  Health Maintenance: Pap: 03-18-14 neg HPV HR neg MMG:  none Colonoscopy:  none BMD:   none TDaP:  2008 Labs: none Self breast exam:done occ   reports that she has never smoked. She has never used smokeless tobacco. She reports that she drinks about 2.4 oz of alcohol per week. She reports that she does not use illicit drugs.  Past Medical History  Diagnosis Date  . GERD (gastroesophageal reflux disease)   . Asthma 02/2007    allergys  . Menstrual migraine     and aura  . Hypermobility of joint     Past Surgical History  Procedure Laterality Date  . Birth mark removal  1986  . Mole removal      Current Outpatient Prescriptions  Medication Sig Dispense Refill  . B Complex Vitamins (B COMPLEX PO) Take by mouth daily.    . cetirizine (ZYRTEC) 10 MG tablet Take 10 mg by mouth daily.      Marland Kitchen ibuprofen (ADVIL,MOTRIN) 100 MG tablet Take 100 mg by mouth as needed for fever.    . JUNEL FE 1/20 1-20 MG-MCG tablet TAKE ONE TABLET BY MOUTH ONE TIME DAILY FOR CONTINUOUS USE 28 tablet 0  . SINGULAIR 10 MG tablet Take 10 mg by mouth daily.    . VENTOLIN HFA 108 (90 BASE) MCG/ACT inhaler Inhale 2 puffs into the lungs as needed.    Marland Kitchen EPIPEN 2-PAK 0.3 MG/0.3ML SOAJ injection as needed.    Marland Kitchen ibuprofen (ADVIL,MOTRIN) 800 MG tablet Take one pill TID with food for 4 days then prn (Patient not taking: Reported on 06/16/2015) 40 tablet 1   No current facility-administered medications for this visit.    Family  History  Problem Relation Age of Onset  . Cancer Mother     lung  . Hyperlipidemia Mother   . Hypertension Father   . Glaucoma Father   . Cancer Maternal Grandmother     lymphatic  . Glaucoma Paternal Grandmother   . Hypertension Paternal Grandmother   . Diabetes Paternal Grandfather   . Hypertension Paternal Grandfather   . Heart disease Paternal Grandfather     heart attack  . Cancer Paternal Grandfather     melanoma  . Dementia Paternal Grandfather   . Heart disease Maternal Aunt     heart attack under age 19    ROS:  Pertinent items are noted in HPI.  Otherwise, a comprehensive ROS was negative.  Exam:   BP 112/78 mmHg  Pulse 70  Resp 16  Ht 5' 4.5" (1.638 m)  Wt 198 lb (89.812 kg)  BMI 33.47 kg/m2  LMP 06/06/2015 Height: 5' 4.5" (163.8 cm) Ht Readings from Last 3 Encounters:  06/16/15 5' 4.5" (1.638 m)  03/01/15 5\' 4"  (1.626 m)  09/20/14 5' 4.25" (1.632 m)    General appearance: alert, cooperative and appears stated age Head: Normocephalic, without obvious abnormality, atraumatic Neck: no adenopathy, supple, symmetrical, trachea midline and thyroid normal to inspection and  palpation Lungs: clear to auscultation bilaterally Breasts: normal appearance, no masses or tenderness, No nipple retraction or dimpling, No nipple discharge or bleeding, No axillary or supraclavicular adenopathy Heart: regular rate and rhythm Abdomen: soft, non-tender; no masses,  no organomegaly Extremities: extremities normal, atraumatic, no cyanosis or edema Skin: Skin color, texture, turgor normal. No rashes or lesions Lymph nodes: Cervical, supraclavicular, and axillary nodes normal. No abnormal inguinal nodes palpated Neurologic: Grossly normal   Pelvic: External genitalia:  no lesions              Urethra:  normal appearing urethra with no masses, tenderness or lesions              Bartholin's and Skene's: normal                 Vagina: normal appearing vagina with normal color  and discharge, no lesions              Cervix: normal, non tender, no lesions              Pap taken: No. Bimanual Exam:  Uterus:  normal size, contour, position, consistency, mobility, non-tender              Adnexa: normal adnexa and no mass, fullness, tenderness               Rectovaginal: Confirms               Anus:  normal sphincter tone, no lesions  A:  Well Woman with normal exam  Contraception Desires OCP  P:   Reviewed health and wellness pertinent to exam  Rx Junel 1/20 Fe see order  Pap smear as above not taken   counseled on breast self exam, STD prevention, HIV risk factors and prevention, use and side effects of OCP's, adequate intake of calcium and vitamin D, diet and exercise return annually or prn  An After Visit Summary was printed and given to the patient.

## 2015-06-18 NOTE — Progress Notes (Signed)
Reviewed personally.  M. Suzanne Sevin Langenbach, MD.  

## 2015-07-14 ENCOUNTER — Encounter: Payer: Self-pay | Admitting: Allergy and Immunology

## 2015-07-14 ENCOUNTER — Ambulatory Visit (INDEPENDENT_AMBULATORY_CARE_PROVIDER_SITE_OTHER): Payer: BLUE CROSS/BLUE SHIELD | Admitting: Allergy and Immunology

## 2015-07-14 VITALS — BP 120/85 | HR 75 | Temp 98.1°F | Resp 16

## 2015-07-14 DIAGNOSIS — J3089 Other allergic rhinitis: Secondary | ICD-10-CM

## 2015-07-14 DIAGNOSIS — J4530 Mild persistent asthma, uncomplicated: Secondary | ICD-10-CM

## 2015-07-14 DIAGNOSIS — J309 Allergic rhinitis, unspecified: Secondary | ICD-10-CM

## 2015-07-14 MED ORDER — MONTELUKAST SODIUM 10 MG PO TABS: 10.0000 mg | ORAL_TABLET | Freq: Every day | ORAL | Status: DC

## 2015-07-14 MED ORDER — ALBUTEROL SULFATE HFA 108 (90 BASE) MCG/ACT IN AERS: 2.0000 | INHALATION_SPRAY | RESPIRATORY_TRACT | Status: DC | PRN

## 2015-07-14 NOTE — Progress Notes (Signed)
FOLLOW UP NOTE  RE: Toni Monroe MRN: [id number] DOB: 03-Feb-1981 [facility] [place] [address] [place] [address] Date of Office Visit: 07/14/2015  Subjective:  Toni Monroe is a 34 y.o. female who presents today for Follow-up of asthma, allergic rhinitis and bee venom allergy.  HPI: Toni Monroe returns to the office reporting feeling well.  She continues to be very pleased with Singulair and is able to train for 1/2 marathons.  She finds when running outdoors for several miles she may use albuterol for chest congestion, but indoor running and heavy indoor exercise with trainer is without any use.  She has no other complaints.  Denies ED or Urgent Care visits any recent Prednisone or antibiotics.  She reports normal sleep and denies congestion, sneezing, itchy watery eyes, cough or wheeze including with recent fluctuant weather changes.  She denies any bee stings.   Current Medications:   1.  Singulair 10 mg daily. 2.  Zyrtec 10 mg daily. 3.  Ventolin as needed. 4.  Epi-pen/Benadryl as needed.  Drug Allergies: Allergies  Allergen Reactions  . Ketoprofen Rash    Objective:   Filed Vitals:   07/14/15 1240  BP: 120/85  Pulse: 75  Temp: 98.1 F (36.7 C)  Resp: 16   Physical Exam  Constitutional: She is well-developed, well-nourished, and in no distress.  HENT:  Right Ear: Tympanic membrane and ear canal normal.  Left Ear: Tympanic membrane and ear canal normal.  Nose: No mucosal edema, rhinorrhea or sinus tenderness.  Mouth/Throat: Uvula is midline and oropharynx is clear and moist. No oropharyngeal exudate or posterior oropharyngeal erythema.  Neck: Neck supple.  Cardiovascular: S1 normal and S2 normal.   Pulmonary/Chest: Effort normal and breath sounds normal. She has no wheezes. She has no rhonchi. She has no rales.  Lymphadenopathy:    She has no cervical adenopathy.    Diagnostics:  FVC 3.75--97%, FEV1 3.25--101%.  Assessment:   1. Asthma, mild persistent, uncomplicated   2. Perennial allergic rhinitis   3.      Hymenoptera hypersensitivity. Plan:  1.  Continue current medication regime daily Zyrtec and Singulair. 2.  Emergency action plan in place, Epi-pen/Benadryl as needed. 3.  Ventolin 2 puffs every 4 hours as needed. 4.  Toni Monroe will call back about beginning venom immunotherapy. Meds ordered this encounter  Medications  . montelukast (SINGULAIR) 10 MG tablet    Sig: Take 1 tablet (10 mg total) by mouth daily.    Dispense:  30 tablet    Refill:  5  . albuterol (VENTOLIN HFA) 108 (90 BASE) MCG/ACT inhaler    Sig: Inhale 2 puffs into the lungs as needed (for cough or wheeze).    Dispense:  1 each    Refill:  1  5.  Follow-up in 6 months or sooner if needed.     Toni Monroe M. Willa Rough, MD   cc: A. Nonodi, MD

## 2015-07-14 NOTE — Progress Notes (Deleted)
Dear ***:  I had the pleasure of seeing Toni Monroe in initial evaluation today.  As you recall {he/she/they:23295} is 34 y.o.  and presents with ***.  After review of {Desc; his/her:32168} history, examination and testing my impression and recommendations are:  Assessment and plan: Problem List Items Addressed This Visit    None    Visit Diagnoses    Asthma, mild persistent, uncomplicated    -  Primary    Relevant Orders    Spirometry with Graph    Perennial allergic rhinitis           History of present illness: No problems updated.  Review of systems: ROS  Past medical history: Past Medical History  Diagnosis Date  . GERD (gastroesophageal reflux disease)   . Asthma 02/2007    allergys  . Menstrual migraine   . Hypermobility of joint     Past surgical history: Past Surgical History  Procedure Laterality Date  . Birth mark removal  1986  . Mole removal      Family history: Family History  Problem Relation Age of Onset  . Cancer Mother     lung  . Hyperlipidemia Mother   . Hypertension Father   . Glaucoma Father   . Cancer Maternal Grandmother     lymphatic  . Glaucoma Paternal Grandmother   . Hypertension Paternal Grandmother   . Diabetes Paternal Grandfather   . Hypertension Paternal Grandfather   . Heart disease Paternal Grandfather     heart attack  . Cancer Paternal Grandfather     melanoma  . Dementia Paternal Grandfather   . Heart disease Maternal Aunt     heart attack under age 29    Social history:  Social History   Social History  . Marital Status: Single    Spouse Name: N/A  . Number of Children: N/A  . Years of Education: N/A   Occupational History  . Not on file.   Social History Main Topics  . Smoking status: Never Smoker   . Smokeless tobacco: Never Used  . Alcohol Use: 2.4 oz/week    4 Standard drinks or equivalent per week  . Drug Use: No  . Sexual Activity: No   Other Topics Concern  . Not on file    Social History Narrative    Environmental History: {environmental history:[phone]}   Known medication allergies: Allergies  Allergen Reactions  . Ketoprofen Rash    Outpatient medications:   Medication List       This list is accurate as of: 07/14/15 12:57 PM.  Always use your most recent med list.               B COMPLEX PO  Take by mouth daily.     cetirizine 10 MG tablet  Commonly known as:  ZYRTEC  Take 10 mg by mouth daily.     EPIPEN 2-PAK 0.3 mg/0.3 mL Soaj injection  Generic drug:  EPINEPHrine  as needed.     ibuprofen 100 MG tablet  Commonly known as:  ADVIL,MOTRIN  Take 100 mg by mouth as needed for fever.     ibuprofen 800 MG tablet  Commonly known as:  ADVIL,MOTRIN  Take one pill TID with food for 4 days then prn     norethindrone-ethinyl estradiol 1-20 MG-MCG tablet  Commonly known as:  JUNEL FE 1/20  Take 1 tablet by mouth daily.     SINGULAIR 10 MG tablet  Generic drug:  montelukast  Take 10  mg by mouth daily.     VENTOLIN HFA 108 (90 BASE) MCG/ACT inhaler  Generic drug:  albuterol  Inhale 2 puffs into the lungs as needed.        Physical examination: Blood pressure 120/85, pulse 75, temperature 98.1 F (36.7 C), temperature source Oral, resp. rate 16, last menstrual period 06/06/2015.  General: Alert, interactive, in no acute distress. HEENT: TMs pearly gray, turbinates {MINIMALLY/MODERATELY/MARKEDLY HIP FRACTURE HPI MD:30618} edematous, post-pharynx {MINIMALLY/MODERATELY/MARKEDLY HIP FRACTURE HPI MD:30618} erythematous. Neck: Supple without lymphadenopathy. Lungs: Clear to auscultation without wheezing, rhonchi or rales. CV: Normal S1, S2 without murmurs. Skin: Warm and dry, without lesions or rashes. Extremities:  No clubbing, cyanosis or edema. Neuro:   Grossly intact.  Diagnostics: Spirometry: FVC 3.75--97%, FEV1  3.25--101%.     Anysia will ***. Additional recommendations will be made at follow-up.  Thank you  very much for this referral.   Please contact me at your earliest convenience with any questions or concerns.  Sincerely,   Victorio Creeden M. Willa Rough, MD

## 2015-08-04 ENCOUNTER — Other Ambulatory Visit: Payer: Self-pay | Admitting: Allergy and Immunology

## 2015-08-11 ENCOUNTER — Telehealth: Payer: Self-pay | Admitting: Certified Nurse Midwife

## 2015-08-11 DIAGNOSIS — Z3041 Encounter for surveillance of contraceptive pills: Secondary | ICD-10-CM

## 2015-08-11 MED ORDER — NORETHIN ACE-ETH ESTRAD-FE 1-20 MG-MCG PO TABS
1.0000 | ORAL_TABLET | Freq: Every day | ORAL | Status: DC
Start: 2015-08-11 — End: 2016-05-09

## 2015-08-11 NOTE — Telephone Encounter (Signed)
yes

## 2015-08-11 NOTE — Telephone Encounter (Signed)
Spoke with patient. Patient states that she has been taking Junel Fe 1-20 for "years" continuously. Patient states since her aex she has been having trouble filling her OCP every 3 weeks. "The pharmacy tells me that there is not a note on the prescription and I keep telling them it is how I take it and that Debbi likely sent it over that way." Advised rx was sent with enough refills for continuous but directions do not indicate taking continuously. Advised I will clarify this with Leota Sauerseborah Leonard CNM and send over new rx with details after reviewing. Patient is agreeable.  Leota Sauerseborah Leonard CNM okay to change rx to state patient to take continuously?

## 2015-08-11 NOTE — Telephone Encounter (Signed)
Spoke with patient. Advised rx for Junel Fe 1-20 #4 2RF with note that patient takes continuously and will need 4 packs for 3 months supply sent to pharmacy on file. Patient is agreeable and verbalizes understanding.  Routing to provider for final review. Patient agreeable to disposition. Will close encounter.

## 2015-08-11 NOTE — Telephone Encounter (Signed)
Patient has a question regarding her junel prescription.

## 2015-08-24 ENCOUNTER — Ambulatory Visit (INDEPENDENT_AMBULATORY_CARE_PROVIDER_SITE_OTHER): Payer: BLUE CROSS/BLUE SHIELD | Admitting: Allergy and Immunology

## 2015-08-24 ENCOUNTER — Encounter: Payer: Self-pay | Admitting: Allergy and Immunology

## 2015-08-24 VITALS — BP 130/74 | HR 76 | Temp 97.7°F | Resp 18

## 2015-08-24 DIAGNOSIS — J309 Allergic rhinitis, unspecified: Secondary | ICD-10-CM | POA: Diagnosis not present

## 2015-08-24 DIAGNOSIS — J4530 Mild persistent asthma, uncomplicated: Secondary | ICD-10-CM | POA: Diagnosis not present

## 2015-08-24 DIAGNOSIS — H1010 Acute atopic conjunctivitis, unspecified eye: Secondary | ICD-10-CM | POA: Diagnosis not present

## 2015-08-24 NOTE — Patient Instructions (Signed)
Take Home Sheet  1. Avoidance: of severe temperature changes.   2. Antihistamine: Zyrtec by mouth once daily for runny nose or itching.   3. Nasal Spray:  Dymista 1 spray(s) each nostril twice daily for stuffy nose or drainage.    4. Inhalers:  Rescue: Ventoin 2 puffs every 4 hours as needed for cough or wheeze.       -May use 2 puffs 10-20 minutes prior to exercise.    6. Other: Singulair 10mg  once daily.       Prednisone 30mg  now.  7. Nasal Saline wash followed by nasal spray twice daily as directed.   8. Follow up Visit: 6 months or sooner if needed.   Websites that have reliable Patient information: 1. American Academy of Asthma, Allergy, & Immunology: www.aaaai.org 2. Food Allergy Network: www.foodallergy.org 3. Mothers of Asthmatics: www.aanma.org 4. [facility]: https://www.strong.com/www.njc.org 5. American College of Allergy, Asthma, & Immunology: BiggerRewards.iswww.allergy.mcg.edu or www.acaai.org

## 2015-08-25 MED ORDER — AZELASTINE-FLUTICASONE 137-50 MCG/ACT NA SUSP: 1.0000 | Freq: Two times a day (BID) | NASAL | Status: DC

## 2015-08-25 MED ORDER — MONTELUKAST SODIUM 10 MG PO TABS: 10.0000 mg | ORAL_TABLET | Freq: Every day | ORAL | Status: DC

## 2015-08-25 NOTE — Progress Notes (Signed)
FOLLOW UP NOTE  RE: Toni Monroe Hreha MRN: [id number] DOB: Feb 28, 1981 [facility] [place] [address] [place] [place] [id number] Date of Office Visit: 08/24/2015  Subjective:  Toni Monroe Toni Monroe is a 34 y.o. female who presents today for Nasal Congestion; Cough; and Headache  Assessment:   1. Allergic rhinoconjunctivitis   2. Asthma, mild persistent, recent rare cough with clear lung exam and excellent in office spirometry.   3.      Probable viral upper respiratory infection, afebrile in no respiratory distress.      Patient Instructions   1. Avoidance: of severe temperature changes and irritant outdoor exposures, code orange days. 2. Antihistamine: Zyrtec by mouth once daily for runny nose or itching. 3. Nasal Spray:  Dymista 1 spray(s) each nostril twice daily for stuffy nose or drainage.  4. Inhalers:  Rescue: Ventoin 2 puffs every 4 hours as needed for cough or wheeze.       -May use 2 puffs 10-20 minutes prior to exercise. 5. Other: Singulair 10mg  once daily.       Prednisone 30mg  now. 6. Nasal Saline wash followed by nasal spray twice daily as directed. 7. Follow up Visit: 6 months or sooner if needed.  HPI: Toni Monroe returns to the office with rhinorrhea, congestion, postnasal drip with hock and spit of clear phlegm in the last 5 days.  She reports a rare cough with possible wheeze without difficulty in breathing, shortness of breath, chest congestion or chest tightness.  She denies fever but has noted ear pressure, and slight headache.  She is unaware of any sick contacts and denies change in appetite, activity and sleep.  Used Sudafed in the recent days and has maintained on  preventative regime as previously.  She was running outside without difficulty.  No recent albuterol use or other new concerns.  Current Medications: 1. Singulair 10 mg daily. 2.  Zyrtec 10 mg daily. 3.  EpiPen, Benadryl and Ventolin  HFA as needed. 4.  Junel and B complex vitamins daily. 5.  Sudafed as needed. 6.  Advil as needed.  Drug Allergies: Allergies  Allergen Reactions  . Ketoprofen Rash   Objective:   Filed Vitals:   08/24/15 1616  BP: 130/74  Pulse: 76  Temp: 97.7 F (36.5 C)  Resp: 18   Physical Exam  Constitutional: She is well-developed, well-nourished, and in no distress.  HENT:  Head: Atraumatic.  Right Ear: Tympanic membrane and ear canal normal.  Left Ear: Tympanic membrane and ear canal normal.  Nose: Mucosal edema present. No rhinorrhea. No epistaxis.  Mouth/Throat: Oropharynx is clear and moist and mucous membranes are normal. No oropharyngeal exudate, posterior oropharyngeal edema or posterior oropharyngeal erythema.  Neck: Neck supple.  Cardiovascular: Normal rate, S1 normal and S2 normal.   No murmur heard. Pulmonary/Chest: Effort normal. She has no wheezes. She has no rhonchi. She has no rales.  Lymphadenopathy:    She has no cervical adenopathy.    Diagnostics: Spirometry:  FVC 3.52--99%, FEV1  3.15--104%.    Toni M. Willa RoughHicks, MD

## 2015-10-25 ENCOUNTER — Ambulatory Visit (INDEPENDENT_AMBULATORY_CARE_PROVIDER_SITE_OTHER): Payer: BLUE CROSS/BLUE SHIELD | Admitting: Sports Medicine

## 2015-10-25 ENCOUNTER — Ambulatory Visit
Admission: RE | Admit: 2015-10-25 | Discharge: 2015-10-25 | Disposition: A | Discharge status: Home / Self Care | Payer: BLUE CROSS/BLUE SHIELD | Source: Ambulatory Visit | Attending: Sports Medicine | Admitting: Sports Medicine

## 2015-10-25 ENCOUNTER — Encounter: Payer: Self-pay | Admitting: Sports Medicine

## 2015-10-25 VITALS — BP 154/109 | Ht 64.0 in | Wt 180.0 lb

## 2015-10-25 DIAGNOSIS — R1030 Lower abdominal pain, unspecified: Secondary | ICD-10-CM

## 2015-10-25 DIAGNOSIS — M25551 Pain in right hip: Secondary | ICD-10-CM

## 2015-10-25 DIAGNOSIS — R1031 Right lower quadrant pain: Secondary | ICD-10-CM | POA: Insufficient documentation

## 2015-10-25 NOTE — Progress Notes (Addendum)
Toni Monroe - 35 y.o. female MRN [id number]  Date of birth: May 13, 1981  CC: Right hip pain  SUBJECTIVE:   HPI Toni Monroe is a very pleasant 35 -year-old female with 3 weeks of right-sided hip and groin pain. - She is an avid runner and the pain began on the return half of her 11 mile run. She finished the run but had significant agony during it.  - For the last 3 weeks she has taken time off from running. - She is still working with her Systems analyst and has intermittent pain as well as pain after her workout.. - She would like to do half marathon back-to-back with a 10K on the weekend of February 25. - As she was not having any day-to-day pain 5 days ago she tried to run and had almost immediate pain within the first mile. Cycling also causes pain. - She denies any fevers chills or night sweats. - She has no history of any stress fractures. -  She is not walking with a limp. - The pain is both located over her lateral hip as well as her groin.  ROS:     10 point review of systems negative other than that listed in history of present illness above for menstrual skeletal issue.  HISTORY: Past Medical, Surgical, Social, and Family History Reviewed & Updated per EMR.  Pertinent Historical Findings include: hypermobility syndrome  OBJECTIVE: BP 154/109 mmHg  Ht  (1.626 m)  Wt 180 lb (81.647 kg)  BMI 30.88 kg/m2  Physical Exam  Calm, non-labored breathing NAD   Hip: ROM IR: 80 Deg, ER: 80 Deg, Flexion: 120 Deg, Extension: 100 Deg, Abduction: 45 Deg, Adduction: 45 Deg Strength IR: 5/5, ER: 5/5, Flexion: 5/5 with mild pain, Extension: 5/5, Abduction: 5/5, Adduction: 5/5 Pelvic alignment unremarkable to inspection and palpation. Greater trochanter mildly tenderness to palpation. Equivocal modified Noble's test (tender at lateral femoral condyle and greater troch with poor laxity) No SI joint tenderness and normal minimal SI movement. Reproducible pain with  FADIR  Negative Hop test  MEDICATIONS, LABS & OTHER ORDERS: Previous Medications   ALBUTEROL (VENTOLIN HFA) 108 (90 BASE) MCG/ACT INHALER    Inhale 2 puffs into the lungs as needed (for cough or wheeze).   AZELASTINE-FLUTICASONE 137-50 MCG/ACT SUSP    Place 1 spray into the nose 2 (two) times daily.   B COMPLEX VITAMINS (B COMPLEX PO)    Take by mouth daily.   CETIRIZINE (ZYRTEC) 10 MG TABLET    Take 10 mg by mouth daily.     EPIPEN 2-PAK 0.3 MG/0.3ML SOAJ INJECTION    as needed.   IBUPROFEN (ADVIL,MOTRIN) 100 MG TABLET    Take 100 mg by mouth as needed for fever.   IBUPROFEN (ADVIL,MOTRIN) 800 MG TABLET    Take one pill TID with food for 4 days then prn   MONTELUKAST (SINGULAIR) 10 MG TABLET    Take 1 tablet (10 mg total) by mouth at bedtime.   NORETHINDRONE-ETHINYL ESTRADIOL (JUNEL FE 1/20) 1-20 MG-MCG TABLET    Take 1 tablet by mouth daily.   PSEUDOEPHEDRINE HCL (SUDAFED PO)    Take by mouth.   Modified Medications   No medications on file   New Prescriptions   No medications on file   Discontinued Medications   No medications on file   Orders Placed This Encounter  Procedures  . DG HIP UNILAT W OR W/O PELVIS 2-3 VIEWS RIGHT  . MR Hip Right Wo Contrast  ASSESSMENT & PLAN: See problem based charting & AVS for pt instructions.

## 2015-11-01 ENCOUNTER — Ambulatory Visit
Admission: RE | Admit: 2015-11-01 | Discharge: 2015-11-01 | Disposition: A | Discharge status: Home / Self Care | Payer: BLUE CROSS/BLUE SHIELD | Source: Ambulatory Visit | Attending: Sports Medicine | Admitting: Sports Medicine

## 2015-11-01 DIAGNOSIS — M25551 Pain in right hip: Secondary | ICD-10-CM

## 2015-11-02 ENCOUNTER — Ambulatory Visit: Payer: BLUE CROSS/BLUE SHIELD | Admitting: Sports Medicine

## 2015-11-03 ENCOUNTER — Telehealth: Payer: Self-pay | Admitting: Sports Medicine

## 2015-11-03 NOTE — Telephone Encounter (Signed)
I spoke with the patient on the phone today after reviewing the MRI of her right hip. It is unremarkable. Specifically, no obvious stress fracture. She is still having some pain in the groin. It is intermittent. I recommended that she work with Karma Lew for treatment on her hip adductors and her iliopsoas. Given the abscence of a stress fracture I think she is safe to continue with all activity, including running, using pain as her guide. She will follow-up with me if symptoms do not resolve over the next 4-6 weeks.

## 2015-12-07 ENCOUNTER — Encounter: Payer: Self-pay | Admitting: Allergy and Immunology

## 2015-12-07 ENCOUNTER — Ambulatory Visit (INDEPENDENT_AMBULATORY_CARE_PROVIDER_SITE_OTHER): Payer: BLUE CROSS/BLUE SHIELD | Admitting: Allergy and Immunology

## 2015-12-07 VITALS — BP 138/78 | HR 88 | Temp 98.0°F | Resp 20

## 2015-12-07 DIAGNOSIS — J309 Allergic rhinitis, unspecified: Secondary | ICD-10-CM | POA: Diagnosis not present

## 2015-12-07 DIAGNOSIS — J4530 Mild persistent asthma, uncomplicated: Secondary | ICD-10-CM | POA: Diagnosis not present

## 2015-12-07 DIAGNOSIS — H1010 Acute atopic conjunctivitis, unspecified eye: Secondary | ICD-10-CM | POA: Diagnosis not present

## 2015-12-07 NOTE — Patient Instructions (Addendum)
   QVAR 4 puffs twice daily.  Continue Singulair, Dymista and Zyrtec daily.  Complete prednisone as prescribed.  Saline nasal wash 2-4 times daily including prior to Dymista.  Ventolin HFA 2 puffs every 4-6 hours as needed.  Call back with up-to-date within the next week.  Follow-up in 4-6 months or sooner if needed.

## 2015-12-08 NOTE — Progress Notes (Signed)
     FOLLOW UP NOTE  RE: Toni Monroe MRN: [id number] DOB: [birth year] [facility] [place] [address] [place] [id number] Date of Office Visit: 12/07/2015  Subjective:  Toni Monroe is a 35 y.o. female who presents today for Cough  Assessment:   1. Mild persistent asthma, intermittent symptoms resolving after half marathon participation.  2.      Allergic rhinoconjuctivitis. 3.      Afebrile in no respiratory distress. Plan:   Patient Instructions  1.  Begin QVAR 80mcg 4 puffs twice daily. 2.  Continue Singulair, Dymista and Zyrtec daily. 3.  Complete prednisone as prescribed. 4.  Saline nasal wash 2-4 times daily including prior to Dymista. 5.  Ventolin HFA 2 puffs every 4-6 hours as needed. 6.  Call back with up-to-date within the next week. 7.  Follow-up in 4-6 months or sooner if needed.  HPI: Toni Monroe, who was last seen November 2016,  returns to the office with cough and congestion.  Toni Monroe began with her symptoms right at end of half marathon, though [place]  Toni Monroe used her albuterol and then decided to be seen at the urgent care when persisting congestion, wheeze and cough without difficulty in breathing or chest tightness.  Toni Monroe thought her cough was initially slightly productive with sore throat and headache which have now resolved.  Toni Monroe received Cortisone injection and is completing day 3 of 5 Prednisone with cough med but decided to follow-up here since back home.  No specific sick contacts and did receive the Flu vaccine.  Denies discolored drainage, fever or disrupted sleep or other associated symptoms.     Denies ED visits or antibiotic courses.   Toni Monroe has a current medication list which includes the following prescription(s): albuterol, b complex vitamins, cetirizine, epipen 2-pak, montelukast, norethindrone-ethinyl estradiol, prednisone, and ibuprofen.   Drug Allergies: Allergies    Allergen Reactions  . Ketoprofen Rash   Objective:   Filed Vitals:   12/07/15 1858  BP: 138/78  Pulse: 88  Temp: 98 F (36.7 C)  Resp: 20   SpO2 Readings from Last 1 Encounters:  12/07/15 98%   Physical Exam  Constitutional: Toni Monroe is well-developed, well-nourished, and in no distress.  HENT:  Head: Atraumatic.  Right Ear: Tympanic membrane and ear canal normal.  Left Ear: Tympanic membrane and ear canal normal.  Nose: Mucosal edema present. No rhinorrhea. No epistaxis.  Mouth/Throat: Oropharynx is clear and moist and mucous membranes are normal. No oropharyngeal exudate, posterior oropharyngeal edema or posterior oropharyngeal erythema.  Neck: Neck supple.  Cardiovascular: Normal rate, S1 normal and S2 normal.   No murmur heard. Pulmonary/Chest: Effort normal. Toni Monroe has no wheezes. Toni Monroe has no rhonchi. Toni Monroe has no rales.  Post/Xopenex:  Continues to be clear without adventious breath sounds.  Patient reports improved.  Lymphadenopathy:    Toni Monroe has no cervical adenopathy.   Diagnostics: Spirometry:   FVC 3.47--98% , FEV1 3.12--104% postbronchodilator essentially no change.    Parthena Fergeson M. Willa RoughHicks, MD  cc: No PCP Per Patient

## 2015-12-11 ENCOUNTER — Ambulatory Visit (INDEPENDENT_AMBULATORY_CARE_PROVIDER_SITE_OTHER): Payer: BLUE CROSS/BLUE SHIELD | Admitting: Allergy and Immunology

## 2015-12-11 ENCOUNTER — Encounter: Payer: Self-pay | Admitting: Allergy and Immunology

## 2015-12-11 VITALS — BP 130/90 | HR 88 | Temp 97.6°F | Resp 20

## 2015-12-11 DIAGNOSIS — J0100 Acute maxillary sinusitis, unspecified: Secondary | ICD-10-CM | POA: Diagnosis not present

## 2015-12-11 DIAGNOSIS — J45901 Unspecified asthma with (acute) exacerbation: Secondary | ICD-10-CM | POA: Diagnosis not present

## 2015-12-11 DIAGNOSIS — H1010 Acute atopic conjunctivitis, unspecified eye: Secondary | ICD-10-CM | POA: Diagnosis not present

## 2015-12-11 DIAGNOSIS — J019 Acute sinusitis, unspecified: Secondary | ICD-10-CM | POA: Insufficient documentation

## 2015-12-11 DIAGNOSIS — J309 Allergic rhinitis, unspecified: Secondary | ICD-10-CM | POA: Diagnosis not present

## 2015-12-11 MED ORDER — AMOXICILLIN-POT CLAVULANATE 875-125 MG PO TABS: ORAL_TABLET | ORAL | Status: DC

## 2015-12-11 MED ORDER — METHYLPREDNISOLONE ACETATE 80 MG/ML IJ SUSP
80.0000 mg | Freq: Once | INTRAMUSCULAR | Status: AC
  Administered 2015-12-11: 80 mg via INTRAMUSCULAR

## 2015-12-11 NOTE — Assessment & Plan Note (Signed)
   Continue appropriate allergen avoidance measures, montelukast 10 mg daily and cetirizine as needed.

## 2015-12-11 NOTE — Progress Notes (Signed)
Follow-up Note  RE: Toni Monroe MRN: [id number] DOB: [birth year] Date of Office Visit: 12/11/2015  Primary care provider: No PCP Per Patient Referring provider: No ref. provider found  History of present illness: HPI Comments: Toni Monroe is a 35 y.o. female is allergic rhinoconjunctivitis, asthma, and hymenoptera venom hypersensitivity who presents today for sick visit.  She was last seen in this office on 12/06/2013 with Dr. Reather Converse for wheezing and chest tightness.  On February 28 she was seen in the urgent care in [place] for sinus pressure, postnasal drainage, nasal congestion, sore throat, and coughing.  She was told that these symptoms were most likely related to her allergies.  She complains that her symptoms have progressed, particularly hoarseness, ear fullness, postnasal drainage, sore throat, hacking cough, and sinus pressure.  She has felt feverish.   Assessment and plan: Acute sinusitis  A prescription has been provided for Augmentin 875-125 mg 3 times a day 10 days.  Depo-Medrol 80 mg was administered in the office.  Prednisone has been provided and is to be started tomorrow as follows: 20 mg daily x 4 days, 10 mg x1 day, then stop.  Guaifenesin 1200 mg twice daily as needed with adequate hydration as discussed.   Nasal saline lavage (NeilMed) as needed has been recommended along with instructions for proper administration.  The patient has been asked to contact me if her symptoms persist or progress. Otherwise, she may return for follow up in 4 months.  Asthma with acute exacerbation  Systemic steroid has been provided (as above).  For now, and during respiratory tract infections and asthma flares, she is to take Qvar 80 g, 3 inhalations via spacer device 3 times a day until lower respiratory symptoms have returned baseline.  Continue montelukast 10 mg daily and albuterol every 4-6 hours as needed.  Allergic rhinoconjunctivitis  Continue  appropriate allergen avoidance measures, montelukast 10 mg daily and cetirizine as needed.    Meds ordered this encounter  Medications  . amoxicillin-clavulanate (AUGMENTIN) 875-125 MG tablet    Sig: Take one tablet by mouth twice daily for 10 days.    Dispense:  20 tablet    Refill:  0  . methylPREDNISolone acetate (DEPO-MEDROL) injection 80 mg    Sig:     Diagnositics: Spirometry reveals FVC of tree 0.37 L and FEV1 of 2.84 L.    Physical examination: Blood pressure 130/90, pulse 88, temperature 97.6 F (36.4 C), temperature source Oral, resp. rate 20.  General: Alert, interactive, in no acute distress. HEENT: TMs pearly gray, turbinates edematous with thick discharge, post-pharynx erythematous. Neck: Supple without lymphadenopathy. Lungs: Clear to auscultation without wheezing, rhonchi or rales. CV: Normal S1, S2 without murmurs. Skin: Warm and dry, without lesions or rashes.  The following portions of the patient's history were reviewed and updated as appropriate: allergies, current medications, past family history, past medical history, past social history, past surgical history and problem list.    Medication List       This list is accurate as of: 12/11/15  1:13 PM.  Always use your most recent med list.               albuterol 108 (90 Base) MCG/ACT inhaler  Commonly known as:  VENTOLIN HFA  Inhale 2 puffs into the lungs as needed (for cough or wheeze).     amoxicillin-clavulanate 875-125 MG tablet  Commonly known as:  AUGMENTIN  Take one tablet by mouth twice daily for 10 days.  B COMPLEX PO  Take by mouth daily.     cetirizine 10 MG tablet  Commonly known as:  ZYRTEC  Take 10 mg by mouth daily.     EPIPEN 2-PAK 0.3 mg/0.3 mL Soaj injection  Generic drug:  EPINEPHrine  as needed.     ibuprofen 800 MG tablet  Commonly known as:  ADVIL,MOTRIN  TAKE ONE PILL THREE TIMES DAILY WITH FOOD FOR 4 DAYS THEN AS NEEDED     montelukast 10 MG tablet    Commonly known as:  SINGULAIR  Take 1 tablet (10 mg total) by mouth at bedtime.     norethindrone-ethinyl estradiol 1-20 MG-MCG tablet  Commonly known as:  JUNEL FE 1/20  Take 1 tablet by mouth daily.     predniSONE 5 MG tablet  Commonly known as:  DELTASONE  Take 5 mg by mouth daily with breakfast. Reported on 12/11/2015        Allergies  Allergen Reactions  . Ketoprofen Rash   Review of systems: Constitutional: Positive for fever.  HENT: Negative for nosebleeds.   Positive for nasal congestion, postnasal drainage, sore throat, and sinus pressure. Eyes: Negative for blurred vision.  Respiratory: Negative for hemoptysis.   Positive for coughing and wheezing. Cardiovascular: Negative for chest pain.  Gastrointestinal: Negative for diarrhea and constipation.  Genitourinary: Negative for dysuria.  Musculoskeletal: Negative for myalgias and joint pain.  Neurological: Negative for dizziness.  Endo/Heme/Allergies: Does not bruise/bleed easily.   Past Medical History  Diagnosis Date  . GERD (gastroesophageal reflux disease)   . Asthma 02/2007    allergys  . Menstrual migraine   . Hypermobility of joint     Family History  Problem Relation Age of Onset  . Cancer Mother     lung  . Hyperlipidemia Mother   . Hypertension Father   . Glaucoma Father   . Cancer Maternal Grandmother     lymphatic  . Glaucoma Paternal Grandmother   . Hypertension Paternal Grandmother   . Diabetes Paternal Grandfather   . Hypertension Paternal Grandfather   . Heart disease Paternal Grandfather     heart attack  . Cancer Paternal Grandfather     melanoma  . Dementia Paternal Grandfather   . Heart disease Maternal Aunt     heart attack under age 35    Social History   Social History  . Marital Status: Single    Spouse Name: N/A  . Number of Children: N/A  . Years of Education: N/A   Occupational History  . Not on file.   Social History Main Topics  . Smoking status: Never Smoker    . Smokeless tobacco: Never Used  . Alcohol Use: 2.4 oz/week    4 Standard drinks or equivalent per week  . Drug Use: No  . Sexual Activity: No   Other Topics Concern  . Not on file   Social History Narrative    I appreciate the opportunity to take part in this ByramElizabeth's care. Please do not hesitate to contact me with questions.  Sincerely,   R. Jorene Guestarter Talaysha Freeberg, MD

## 2015-12-11 NOTE — Assessment & Plan Note (Signed)
   Systemic steroid has been provided (as above).  For now, and during respiratory tract infections and asthma flares, she is to take Qvar 80 g, 3 inhalations via spacer device 3 times a day until lower respiratory symptoms have returned baseline.  Continue montelukast 10 mg daily and albuterol every 4-6 hours as needed.

## 2015-12-11 NOTE — Addendum Note (Signed)
Addended by: Clarene CritchleySMITH, Chanita Boden G on: 12/11/2015 01:55 PM   Modules accepted: Kipp BroodSmartSet

## 2015-12-11 NOTE — Assessment & Plan Note (Signed)
   A prescription has been provided for Augmentin 875-125 mg 3 times a day 10 days.  Depo-Medrol 80 mg was administered in the office.  Prednisone has been provided and is to be started tomorrow as follows: 20 mg daily x 4 days, 10 mg x1 day, then stop.  Guaifenesin 1200 mg twice daily as needed with adequate hydration as discussed.   Nasal saline lavage (NeilMed) as needed has been recommended along with instructions for proper administration.  The patient has been asked to contact me if her symptoms persist or progress. Otherwise, she may return for follow up in 4 months.

## 2015-12-11 NOTE — Patient Instructions (Addendum)
Acute sinusitis  A prescription has been provided for Augmentin 875-125 mg 3 times a day 10 days.  Depo-Medrol 80 mg was administered in the office.  Prednisone has been provided and is to be started tomorrow as follows: 20 mg daily x 4 days, 10 mg x1 day, then stop.  Guaifenesin 1200 mg twice daily as needed with adequate hydration as discussed.   Nasal saline lavage (NeilMed) as needed has been recommended along with instructions for proper administration.  The patient has been asked to contact me if her symptoms persist or progress. Otherwise, she may return for follow up in 4 months.  Asthma with acute exacerbation  Systemic steroid has been provided (as above).  For now, and during respiratory tract infections and asthma flares, she is to take Qvar 80 g, 3 inhalations via spacer device 3 times a day until lower respiratory symptoms have returned baseline.  Continue montelukast 10 mg daily and albuterol every 4-6 hours as needed.  Allergic rhinoconjunctivitis  Continue appropriate allergen avoidance measures, montelukast 10 mg daily and cetirizine as needed.    Return in about 4 months (around 04/11/2016), or if symptoms worsen or fail to improve.

## 2016-01-29 ENCOUNTER — Other Ambulatory Visit: Payer: Self-pay | Admitting: *Deleted

## 2016-01-29 MED ORDER — IBUPROFEN 800 MG PO TABS: 800.0000 mg | ORAL_TABLET | Freq: Three times a day (TID) | ORAL | Status: DC | PRN

## 2016-02-11 ENCOUNTER — Other Ambulatory Visit: Payer: Self-pay | Admitting: Allergy and Immunology

## 2016-04-25 ENCOUNTER — Encounter: Payer: Self-pay | Admitting: Allergy and Immunology

## 2016-04-25 ENCOUNTER — Ambulatory Visit (INDEPENDENT_AMBULATORY_CARE_PROVIDER_SITE_OTHER): Payer: BLUE CROSS/BLUE SHIELD | Admitting: Allergy and Immunology

## 2016-04-25 VITALS — BP 118/88 | HR 76 | Temp 98.4°F | Resp 16

## 2016-04-25 DIAGNOSIS — J4530 Mild persistent asthma, uncomplicated: Secondary | ICD-10-CM

## 2016-04-25 DIAGNOSIS — H1010 Acute atopic conjunctivitis, unspecified eye: Secondary | ICD-10-CM | POA: Diagnosis not present

## 2016-04-25 DIAGNOSIS — J309 Allergic rhinitis, unspecified: Secondary | ICD-10-CM

## 2016-04-25 MED ORDER — HYDROXYZINE HCL 10 MG PO TABS: 10.0000 mg | ORAL_TABLET | Freq: Every day | ORAL | Status: DC

## 2016-04-25 MED ORDER — MONTELUKAST SODIUM 10 MG PO TABS: ORAL_TABLET | ORAL | Status: DC

## 2016-04-25 NOTE — Progress Notes (Signed)
FOLLOW UP NOTE  RE: LEONARD HENDLER MRN: [id number] DOB: 1981-07-09 [facility] [place] [address] [place] [address] Date of Office Visit: 04/25/2016  Subjective:  Toni Monroe is a 35 y.o. female who presents today for Medication Refill  Assessment:   1. Allergic rhinoconjunctivitis.   2. Mild persistent asthma, well controlled.  3.      Patient reports of previous history of rash related to heavy sun exposure, clear skin today. 4.      Previous concern for hymenoptera hypersensitivity--avoidance and emergency action plan in place. Plan:   Meds ordered this encounter  Medications  . montelukast (SINGULAIR) 10 MG tablet    Sig: Take one tablet each evening to prevent cough or wheeze.    Dispense:  30 tablet    Refill:  5  . hydrOXYzine (ATARAX/VISTARIL) 10 MG tablet    Sig: Take 1 tablet (10 mg total) by mouth at bedtime.    Dispense:  30 tablet    Refill:  0  1.    Continue current medication regime. 2.    If acute respiratory symptoms add QVAR 2 puffs twice daily---as previously. 3.    If needed add Hydrocortisone cream 1% twice daily to rash. 4.    Sunscreen as discussed. 5.    Hydroxyzine  each evening as needed for itching (reviewed drowsiness potential)--call with any recurring use. 6.    May use OTC hydrocortisone cream if needed for rash---call with any recurring use. 7.    Receive influenza vaccine this fall season through primary MD. 8.    Follow-up in 6 months or sooner if needed.  HPI: Toni Monroe returns to the office in follow-up of allergic rhinoconjunctivitis and asthma.  She reports feeling well and is without new concerns today.  Since her last visit in March she denies in further antibiotics or Prednisone.  She has not used QVAR or ProAir in the recent months and very pleased with how well she has done.  Denies nocturnal awakenings and she completed a half marathon in May without difficulty.  Denies any  nasal congestion, sneezing, rhinorrhea, cough or wheeze.  She does recall that when spends extended time outdoors (at beach), occasionally has recalled mild rash areas.  Seemed very pruritic but no hives or swelling.  She is requesting an anti-itch management if any concerns.  She does have a sunscreen that she tolerates without difficulty.  No GE reflux, food concerns nor recent bee stings. Denies ED or urgent care visits. Reports sleep and activity are normal.  [name] has a current medication list which includes the following prescription(s): albuterol, b complex vitamins, cetirizine, epipen 2-pak, ibuprofen, montelukast, norethindrone-ethinyl estradiol.   Drug Allergies: Allergies  Allergen Reactions  . Ketoprofen Rash   Objective:   Filed Vitals:   04/25/16 0850  BP: 118/88  Pulse: 76  Temp: 98.4 F (36.9 C)  Resp: 16   SpO2 Readings from Last 1 Encounters:  04/25/16 98%   Physical Exam  Constitutional: She is well-developed, well-nourished, and in no distress.  HENT:  Head: Atraumatic.  Right Ear: Tympanic membrane and ear canal normal.  Left Ear: Tympanic membrane and ear canal normal.  Nose: Mucosal edema present. No rhinorrhea. No epistaxis.  Mouth/Throat: Oropharynx is clear and moist and mucous membranes are normal. No oropharyngeal exudate, posterior oropharyngeal edema or posterior oropharyngeal erythema.  Neck: Neck supple.  Cardiovascular: Normal rate, S1 normal and S2 normal.   No murmur heard. Pulmonary/Chest:  Effort normal. She has no wheezes. She has no rhonchi. She has no rales.  Lymphadenopathy:    She has no cervical adenopathy.  Skin: Skin is warm.  Clear without rash.   Diagnostics: Spirometry:  FVC 3.58--101%, FEV1 3.13--104%.  ACT=24.    Roselyn M. Willa RoughHicks, MD  cc: No PCP Per Patient

## 2016-04-25 NOTE — Patient Instructions (Signed)
   Continue current medication regime.  If acute respiratory symptoms add QVAR 2 puffs twice daily---as previously.  If needed add Hydrocortisone cream 1% twice daily to rash.  Sunscreen as discussed.  Hydroxyzine 10mg  each evening.  Follow-up in 6 months or sooner if needed.

## 2016-05-09 ENCOUNTER — Other Ambulatory Visit: Payer: Self-pay | Admitting: Certified Nurse Midwife

## 2016-05-09 DIAGNOSIS — Z3041 Encounter for surveillance of contraceptive pills: Secondary | ICD-10-CM

## 2016-05-09 NOTE — Telephone Encounter (Signed)
Medication refill request: Junel Last AEX:  06/16/15 DL Next AEX: 5/62/13 DL Last MMG (if hormonal medication request): n/a Refill authorized: 08/11/15 #4Packages 2R. Please advise. Thank you.

## 2016-06-04 ENCOUNTER — Other Ambulatory Visit: Payer: Self-pay | Admitting: *Deleted

## 2016-06-04 MED ORDER — HYDROXYZINE HCL 10 MG PO TABS: 10.0000 mg | ORAL_TABLET | Freq: Every day | ORAL | 1 refills | Status: DC

## 2016-06-21 ENCOUNTER — Ambulatory Visit: Payer: BLUE CROSS/BLUE SHIELD | Admitting: Certified Nurse Midwife

## 2016-06-21 ENCOUNTER — Encounter: Payer: Self-pay | Admitting: Certified Nurse Midwife

## 2016-06-21 VITALS — BP 114/78 | HR 70 | Resp 16 | Ht 64.25 in | Wt 209.0 lb

## 2016-06-21 DIAGNOSIS — Z3041 Encounter for surveillance of contraceptive pills: Secondary | ICD-10-CM

## 2016-06-21 DIAGNOSIS — Z124 Encounter for screening for malignant neoplasm of cervix: Secondary | ICD-10-CM

## 2016-06-21 DIAGNOSIS — Z23 Encounter for immunization: Secondary | ICD-10-CM | POA: Diagnosis not present

## 2016-06-21 DIAGNOSIS — Z0000 Encounter for general adult medical examination without abnormal findings: Secondary | ICD-10-CM | POA: Diagnosis not present

## 2016-06-21 DIAGNOSIS — Z01419 Encounter for gynecological examination (general) (routine) without abnormal findings: Secondary | ICD-10-CM

## 2016-06-21 LAB — CBC
HEMATOCRIT: 41.1 % (ref 35.0–45.0)
Hemoglobin: 14.1 g/dL (ref 11.7–15.5)
MCH: 31 pg (ref 27.0–33.0)
MCHC: 34.3 g/dL (ref 32.0–36.0)
MCV: 90.3 fL (ref 80.0–100.0)
MPV: 8.8 fL (ref 7.5–12.5)
Platelets: 278 10*3/uL (ref 140–400)
RBC: 4.55 MIL/uL (ref 3.80–5.10)
RDW: 13.1 % (ref 11.0–15.0)
WBC: 7.5 10*3/uL (ref 3.8–10.8)

## 2016-06-21 LAB — LIPID PANEL
CHOLESTEROL: 226 mg/dL — AB (ref 125–200)
HDL: 47 mg/dL (ref >=46)
LDL CALC: 150 mg/dL — AB (ref <130)
Total CHOL/HDL Ratio: 4.8 Ratio (ref <=5.0)
Triglycerides: 146 mg/dL (ref <150)
VLDL: 29 mg/dL (ref <30)

## 2016-06-21 LAB — TSH: TSH: 3.27 mIU/L

## 2016-06-21 MED ORDER — NORETHIN ACE-ETH ESTRAD-FE 1-20 MG-MCG PO TABS: 1.0000 | ORAL_TABLET | Freq: Every day | ORAL | 5 refills | Status: DC

## 2016-06-21 NOTE — Patient Instructions (Signed)

## 2016-06-21 NOTE — Progress Notes (Signed)
35 y.o. G0P0000 Single  Caucasian Fe here for annual exam. Periods once every 3 months with continuous OCP and decrease headache occurrence. Would like to continue with this regimen.. Completed her runs with 3 Marathons. Now has some running pain in legs, but is not running now.  Not sexually active. Busy with assuming more responsibility in job promotion. Screening labs if indicated. No health issues today. Vacationed in [place] and [place].  No LMP recorded. Patient is not currently having periods (Reason: Oral contraceptives).          Sexually active: No.  The current method of family planning is OCP (estrogen/progesterone).    Exercising: Yes.    running & weights Smoker:  no  Health Maintenance: Pap:  03-18-14 neg HPV HR neg MMG:  none Colonoscopy:  none BMD:   none TDaP:  2008 Shingles: no Pneumonia: no Hep C and HIV: HIV false positive & confirmation test neg yrs ago Labs: none Self breast exam: done occ   reports that she has never smoked. She has never used smokeless tobacco. She reports that she drinks about 2.4 oz of alcohol per week . She reports that she does not use drugs.  Past Medical History:  Diagnosis Date  . Asthma 02/2007   allergys  . GERD (gastroesophageal reflux disease)   . Hypermobility of joint   . Menstrual migraine     Past Surgical History:  Procedure Laterality Date  . birth mark removal  34  . MOLE REMOVAL      Current Outpatient Prescriptions  Medication Sig Dispense Refill  . albuterol (VENTOLIN HFA) 108 (90 BASE) MCG/ACT inhaler Inhale 2 puffs into the lungs as needed (for cough or wheeze). 1 each 1  . B Complex Vitamins (B COMPLEX PO) Take by mouth daily.    . cetirizine (ZYRTEC) 10 MG tablet Take 10 mg by mouth daily.      Marland Kitchen ibuprofen (ADVIL,MOTRIN) 800 MG tablet Take 1 tablet (800 mg total) by mouth every 8 (eight) hours as needed. 30 tablet 0  . JUNEL FE 1/20 1-20 MG-MCG tablet TAKE 1 TABLET ONCE DAILY. 28 tablet 2  . montelukast  (SINGULAIR) 10 MG tablet Take one tablet each evening to prevent cough or wheeze. 30 tablet 5  . EPIPEN 2-PAK 0.3 MG/0.3ML SOAJ injection as needed.     No current facility-administered medications for this visit.     Family History  Problem Relation Age of Onset  . Cancer Mother     lung  . Hyperlipidemia Mother   . Hypertension Father   . Glaucoma Father   . Cancer Maternal Grandmother     lymphatic  . Glaucoma Paternal Grandmother   . Hypertension Paternal Grandmother   . Diabetes Paternal Grandfather   . Hypertension Paternal Grandfather   . Heart disease Paternal Grandfather     heart attack  . Cancer Paternal Grandfather     melanoma  . Dementia Paternal Grandfather   . Heart disease Maternal Aunt     heart attack under age 25    ROS:  Pertinent items are noted in HPI.  Otherwise, a comprehensive ROS was negative.  Exam:   BP 114/78   Pulse 70   Resp 16   Ht 5' 4.25" (1.632 m)   Wt 209 lb (94.8 kg)   BMI 35.60 kg/m  Height: 5' 4.25" (163.2 cm) Ht Readings from Last 3 Encounters:  06/21/16 5' 4.25" (1.632 m)  10/25/15 5\' 4"  (1.626 m)  06/16/15 5' 4.5" (  1.638 m)    General appearance: alert, cooperative and appears stated age Head: Normocephalic, without obvious abnormality, atraumatic Neck: no adenopathy, supple, symmetrical, trachea midline and thyroid normal to inspection and palpation Lungs: clear to auscultation bilaterally Breasts: normal appearance, no masses or tenderness, No nipple retraction or dimpling, No nipple discharge or bleeding, No axillary or supraclavicular adenopathy Heart: regular rate and rhythm Abdomen: soft, non-tender; no masses,  no organomegaly Extremities: extremities normal, atraumatic, no cyanosis or edema Skin: Skin color, texture, turgor normal. No rashes or lesions Lymph nodes: Cervical, supraclavicular, and axillary nodes normal. No abnormal inguinal nodes palpated Neurologic: Grossly normal   Pelvic: External genitalia:   no lesions              Urethra:  normal appearing urethra with no masses, tenderness or lesions              Bartholin's and Skene's: normal                 Vagina: normal appearing vagina with normal color and discharge, no lesions              Cervix: no cervical motion tenderness, no lesions and nulliparous appearance              Pap taken: Yes.   Bimanual Exam:  Uterus:  normal size, contour, position, consistency, mobility, non-tender              Adnexa: normal adnexa and no mass, fullness, tenderness               Rectovaginal: Confirms               Anus:  normal sphincter tone, no lesions  Chaperone present: yes  A:  Well Woman with normal exam  Contraception OCP desired continuous use working well for decrease in premenstrual headaches  Immunization due  Screening labs   P:   Reviewed health and wellness pertinent to exam  Rx Junel 1/20 see order continuous use  Requests TDAP  Lab: TSH, Vitamin D,Lipid panel, CBC  Pap smear as above with HPVHR    counseled on breast self exam, STD prevention, HIV risk factors and prevention, adequate intake of calcium and vitamin D, diet and exercise  return annually or prn  An After Visit Summary was printed and given to the patient.  -Tdap given in L Deltoid today. -sco

## 2016-06-22 LAB — VITAMIN D 25 HYDROXY (VIT D DEFICIENCY, FRACTURES): Vit D, 25-Hydroxy: 30 ng/mL (ref 30–100)

## 2016-06-23 NOTE — Progress Notes (Signed)
Encounter reviewed Jill Jertson, MD   

## 2016-06-24 ENCOUNTER — Telehealth: Payer: Self-pay

## 2016-06-24 NOTE — Telephone Encounter (Signed)
Patient advised of results. See result note. -sco

## 2016-06-24 NOTE — Telephone Encounter (Signed)
Left message for patient to return my call to discuss lab results. -sco

## 2016-06-24 NOTE — Telephone Encounter (Signed)
-----   Message from Verner Choleborah S Leonard, CNM sent at 06/23/2016  8:51 PM EDT ----- Notify patient that Vitamin D is borderline normal at 30. Start on vitamin D3 1000 IU daily. This should maintain level. TSH is normal, Cholesterol is elevated at 226. Need to work on weight loss and decrease in cholesterol foods in diet LDL cholesterol is also borderline high at 150, normal 130 or less. Add the exercise as tolerated at least 3-4 times weekly. Recheck at next aex CBC normal

## 2016-06-25 LAB — IPS PAP TEST WITH HPV

## 2016-07-10 ENCOUNTER — Telehealth: Payer: Self-pay | Admitting: Certified Nurse Midwife

## 2016-07-10 ENCOUNTER — Encounter: Payer: Self-pay | Admitting: *Deleted

## 2016-07-10 ENCOUNTER — Other Ambulatory Visit: Payer: Self-pay | Admitting: *Deleted

## 2016-07-10 NOTE — Telephone Encounter (Signed)
Spoke with patient. Advised letter that was requested is available for pick up in office. Patient verbalizes understanding.     Routing to provider for final review. Patient is agreeable to disposition. Will close encounter.

## 2016-07-10 NOTE — Telephone Encounter (Signed)
Patient says she need a letter written that specifically says she had a pap smear done for an insurance claim.

## 2016-07-10 NOTE — Telephone Encounter (Signed)
Spoke with patient. Patient states she has a secondary health insurance that reimburses for wellness visits. Patient request a letter with date of pap, provider, and her name. Patient to pick up letter from office. Will notify patient when ready for pick up.    Toni Monroe, I have a letter that is pending upon your approval. OK to print?

## 2016-07-10 NOTE — Telephone Encounter (Signed)
yes

## 2016-11-07 ENCOUNTER — Encounter: Payer: Self-pay | Admitting: Allergy

## 2016-11-07 ENCOUNTER — Ambulatory Visit (INDEPENDENT_AMBULATORY_CARE_PROVIDER_SITE_OTHER): Payer: BLUE CROSS/BLUE SHIELD | Admitting: Allergy

## 2016-11-07 VITALS — BP 140/90 | HR 82 | Temp 98.4°F | Resp 14 | Ht 64.0 in | Wt 213.0 lb

## 2016-11-07 DIAGNOSIS — H1010 Acute atopic conjunctivitis, unspecified eye: Secondary | ICD-10-CM

## 2016-11-07 DIAGNOSIS — Z91038 Other insect allergy status: Secondary | ICD-10-CM

## 2016-11-07 DIAGNOSIS — J4530 Mild persistent asthma, uncomplicated: Secondary | ICD-10-CM

## 2016-11-07 DIAGNOSIS — J309 Allergic rhinitis, unspecified: Secondary | ICD-10-CM

## 2016-11-07 DIAGNOSIS — Z872 Personal history of diseases of the skin and subcutaneous tissue: Secondary | ICD-10-CM | POA: Diagnosis not present

## 2016-11-07 DIAGNOSIS — Z91030 Bee allergy status: Secondary | ICD-10-CM

## 2016-11-07 MED ORDER — HYDROXYZINE HCL 10 MG/5ML PO SYRP: 10.0000 mg | ORAL_SOLUTION | ORAL | 5 refills | Status: DC

## 2016-11-07 MED ORDER — EPINEPHRINE 0.3 MG/0.3ML IJ SOAJ: 0.3000 mg | Freq: Once | INTRAMUSCULAR | 1 refills | Status: AC

## 2016-11-07 NOTE — Progress Notes (Signed)
Follow-up Note  RE: Toni Monroe MRN: [id number] DOB: 02-22-1981 Date of Office Visit: 11/07/2016   History of present illness: Toni Monroe is a 36 y.o. female presenting today for follow-up of asthma, allergic rhinoconjunctivitis, hymenoptera hypersensitivity and history of heat induced urticaria. She was last seen in the office for follow-up on 04/25/2016 by Dr. Willa RoughHicks. She has done well since this time without any major health changes, surgeries or hospitalizations. She takes Singulair daily which had controlled her allergy symptoms as well as her asthma symptoms.  She does not take an antihistamine on a regular basis. She does not use any nasal sprays on a regular basis. In regards her asthma she reports she has done well. She will use Qvar during illnesses or with any flares of her asthma. She has not needed to do this since her last visit. She states it has been at least 1-2 years since used Qvar. She has access to her rescue inhaler which she has not needed to use since last visit. She denies any daytime or nighttime symptoms and no nighttime awakenings. She has not required any oral steroids, ED or urgent care visits.  She uses atarax usually in the summer when she goes on her annual beach trip.   she has history of heat induced urticaria. She did not need to use her Atarax at her trip this past summer she reports it did not get that hot.  She also has skin reaction to sunscreen that she avoids this as well.  She has a mild hymenoptera allergy and has access to an EpiPen and requests refill today.      Review of systems: Review of Systems  Constitutional: Positive for fever. Negative for chills and malaise/fatigue.  HENT: Negative for congestion, ear pain, nosebleeds, sinus pain and sore throat.   Eyes: Negative for discharge and redness.  Respiratory: Negative for cough, shortness of breath and wheezing.   Cardiovascular: Negative for chest pain.    Gastrointestinal: Negative for abdominal pain, heartburn, nausea and vomiting.  Skin: Negative for itching and rash.    All other systems negative unless noted above in HPI  Past medical/social/surgical/family history have been reviewed and are unchanged unless specifically indicated below.  No changes  Medication List: Allergies as of 11/07/2016      Reactions   Ketoprofen Rash      Medication List       Accurate as of 11/07/16  6:35 PM. Always use your most recent med list.          albuterol 108 (90 Base) MCG/ACT inhaler Commonly known as:  VENTOLIN HFA Inhale 2 puffs into the lungs as needed (for cough or wheeze).   B COMPLEX PO Take by mouth daily.   cetirizine 10 MG tablet Commonly known as:  ZYRTEC Take 10 mg by mouth daily.   ibuprofen 800 MG tablet Commonly known as:  ADVIL,MOTRIN Take 1 tablet (800 mg total) by mouth every 8 (eight) hours as needed.   montelukast 10 MG tablet Commonly known as:  SINGULAIR Take one tablet each evening to prevent cough or wheeze.   norethindrone-ethinyl estradiol 1-20 MG-MCG tablet Commonly known as:  JUNEL FE 1/20 Take 1 tablet by mouth daily.       Known medication allergies: Allergies  Allergen Reactions  . Ketoprofen Rash     Physical examination: Blood pressure 140/90, pulse 82, temperature 98.4 F (36.9 C), temperature source Oral, resp. rate 14, height 5\' 4"  (1.626 m), weight  213 lb (96.6 kg), SpO2 99 %.  General: Alert, interactive, in no acute distress. HEENT: TMs pearly gray, turbinates minimally edematous without discharge, post-pharynx non erythematous. Neck: Supple without lymphadenopathy. Lungs: Clear to auscultation without wheezing, rhonchi or rales. {no increased work of breathing. CV: Normal S1, S2 without murmurs. Abdomen: Nondistended, nontender. Skin: Warm and dry, without lesions or rashes. Extremities:  No clubbing, cyanosis or edema. Neuro:   Grossly  intact.  Diagnositics/Labs:  Spirometry: FEV1: 3.05L  104%, FVC: 3.63L 105%, ratio consistent with Nonobstructive pattern  Assessment and plan:  Allergic rhinoconjunctivitis  - Continue Singulair 10 mg daily  - Provided with dymista sample use 1 spray twice a day.    - May use over-the-counter antihistamine like Allegra or Zyrtec besides all as needed use  Mild persistent asthma  - Well controlled  - If she starts to struggle with asthma symptoms advised that she please let us know or if you are needing to use your  albuterol frequently.   If this occurs will resume her Qvar   - Use albuterol 2 puffs every 4 hours as needed for cough, wheeze, chest tightness and shortness of breath and monitor frequency of use  Heat induced urticaria  - Occurs in the summer. She will have access to Atarax for urticaria and itch control  Hymenoptera hypersensitivity  - Continue avoidance and follow emergency action plan in case of reaction. He has access to  Follow-up in 6 months or sooner if needed.  I appreciate the opportunity to take part in [employer] [facility] care. Please do not hesitate to contact me with questions.  Sincerely,   Margo Aye, MD Allergy/Immunology [facility]

## 2016-11-07 NOTE — Patient Instructions (Signed)
   Continue current medication regime.  If you are struggling with asthma symptoms please let us know or if you are needing to use your  albuterol frequently.   Will   Provide with dymista sample use 1 spray twice a day.    If needed add Hydrocortisone cream 1% twice daily to rash.  Hydroxyzine 10mg  each evening for hive/itch control.  Follow-up in 6 months or sooner if needed.

## 2016-12-03 ENCOUNTER — Other Ambulatory Visit: Payer: Self-pay

## 2016-12-03 MED ORDER — MONTELUKAST SODIUM 10 MG PO TABS: ORAL_TABLET | ORAL | 5 refills | Status: DC

## 2017-01-14 ENCOUNTER — Other Ambulatory Visit: Payer: Self-pay | Admitting: *Deleted

## 2017-01-14 MED ORDER — IBUPROFEN 800 MG PO TABS: 800.0000 mg | ORAL_TABLET | Freq: Three times a day (TID) | ORAL | 1 refills | Status: AC | PRN

## 2017-04-24 ENCOUNTER — Ambulatory Visit
Admission: RE | Admit: 2017-04-24 | Discharge: 2017-04-24 | Disposition: A | Discharge status: Home / Self Care | Payer: BLUE CROSS/BLUE SHIELD | Source: Ambulatory Visit | Attending: Sports Medicine | Admitting: Sports Medicine

## 2017-04-24 ENCOUNTER — Ambulatory Visit (INDEPENDENT_AMBULATORY_CARE_PROVIDER_SITE_OTHER): Payer: BLUE CROSS/BLUE SHIELD | Admitting: Sports Medicine

## 2017-04-24 VITALS — BP 122/82 | Ht 64.0 in | Wt 210.0 lb

## 2017-04-24 DIAGNOSIS — M25552 Pain in left hip: Secondary | ICD-10-CM

## 2017-04-24 NOTE — Progress Notes (Signed)
   Subjective:    Patient ID: Toni Monroe, female    DOB: 06/14/1981, 36 y.o.   MRN: [id number]  HPI chief complaint: Left hip pain  Patient comes in today complaining of 6 weeks of left hip pain. Pain began acutely while doing some lunges. While in a deep lunge position she felt a sudden onset of acute pain in the groin. She took a few days off from working out but has been unable to return to full activity. She states that she gets returning pain about 20 minutes into any sort of workout. She will get pain with prolonged walking. Pain is diffuse around the hip. No mechanical symptoms. She has a history of right hip pain for which a previous MRI was unremarkable. Her pain feels similar to that. She responded well to physical therapy with [facility] SiaJohn O'Halloran. She has been wearing a compression sleeve around her left hip which has been somewhat helpful. She takes 800 mg of ibuprofen as needed. That too will help. No numbness or tingling. No low back pain.  Intermittent history reviewed Medications reviewed Allergies reviewed    Review of Systems    as above Objective:   Physical Exam  Well-developed, well-nourished. No acute distress. Awake alert and oriented 3. Vital signs reviewed  Left hip: There is a marked decrease in passive internal rotation of the left hip compared to the right. No pain however. No tenderness to palpation. Negative hop test. Negative FADIR. Neurovascularly intact distally. Walking without a limp.  X-rays of the pelvis and left hip are reviewed. No obvious abnormality is seen. No significant degenerative changes appreciated. No stress fracture.      Assessment & Plan:   Left hip pain and decreased range of motion  Since the patient responded well to physical therapy before, I will have her work again with [facility] SiaJohn O'Halloran. Follow-up with me in 4 weeks for reevaluation. She certainly has limited internal rotation of this left hip and her history is somewhat  concerning for possible labral tear. If she makes no improvement with initial PT then I will consider MRI arthrogram specifically to rule out labral tear. Patient is encouraged to call me with questions or concerns in the interim. She'll continue with her 800 mg ibuprofen as needed. Continue with her compression wrap with activity as well.

## 2017-05-12 ENCOUNTER — Ambulatory Visit: Payer: BLUE CROSS/BLUE SHIELD | Admitting: Sports Medicine

## 2017-05-15 ENCOUNTER — Ambulatory Visit: Payer: BLUE CROSS/BLUE SHIELD | Admitting: Allergy

## 2017-05-22 ENCOUNTER — Encounter: Payer: Self-pay | Admitting: Allergy

## 2017-05-22 ENCOUNTER — Ambulatory Visit (INDEPENDENT_AMBULATORY_CARE_PROVIDER_SITE_OTHER): Payer: BLUE CROSS/BLUE SHIELD | Admitting: Allergy

## 2017-05-22 VITALS — BP 120/82 | HR 76 | Resp 16

## 2017-05-22 DIAGNOSIS — Z91030 Bee allergy status: Secondary | ICD-10-CM

## 2017-05-22 DIAGNOSIS — J309 Allergic rhinitis, unspecified: Secondary | ICD-10-CM

## 2017-05-22 DIAGNOSIS — H1010 Acute atopic conjunctivitis, unspecified eye: Secondary | ICD-10-CM

## 2017-05-22 DIAGNOSIS — Z91038 Other insect allergy status: Secondary | ICD-10-CM

## 2017-05-22 DIAGNOSIS — J069 Acute upper respiratory infection, unspecified: Secondary | ICD-10-CM

## 2017-05-22 DIAGNOSIS — Z872 Personal history of diseases of the skin and subcutaneous tissue: Secondary | ICD-10-CM | POA: Diagnosis not present

## 2017-05-22 DIAGNOSIS — J4530 Mild persistent asthma, uncomplicated: Secondary | ICD-10-CM | POA: Diagnosis not present

## 2017-05-22 MED ORDER — ALBUTEROL SULFATE HFA 108 (90 BASE) MCG/ACT IN AERS: 2.0000 | INHALATION_SPRAY | RESPIRATORY_TRACT | 1 refills | Status: DC | PRN

## 2017-05-22 MED ORDER — MONTELUKAST SODIUM 10 MG PO TABS: ORAL_TABLET | ORAL | 5 refills | Status: DC

## 2017-05-22 NOTE — Patient Instructions (Signed)
Allergic rhinoconjunctivitis  - Continue Singulair 10 mg daily  - Provided with dymista sample use 1 spray twice a day to help with nasal drainage/post-nasal drip  - continue nasal saline rinse/spray   - continue Zyrtec daily  Exercise-induced asthma  - Well controlled at this time  - have access to albuterol inhaler 2 puffs every 4-6 hours as needed for cough/wheeze/shortness of breath/chest tightness.  May use 15-20 minutes prior to activity.   Monitor frequency of use.    Let us know if you are not meeting the below goals Asthma control goals:   Full participation in all desired activities (may need albuterol before activity)  Albuterol use two time or less a week on average (not counting use with activity)  Cough interfering with sleep two time or less a month  Oral steroids no more than once a year  No hospitalizations   Heat induced hives  - use Atarax for hives and itch control as needed  Hymenoptera hypersensitivity  - Continue avoidance of stinging insets and follow emergency action plan in case of reaction. [facility] for as needed use in case of severe reaction if stung  Follow-up in 12 months or sooner if needed.

## 2017-05-22 NOTE — Progress Notes (Signed)
Follow-up Note  RE: Toni Monroe MRN: [id number] DOB: 1981-04-11 Date of Office Visit: 05/22/2017   History of present illness: Toni Monroe is a 36 y.o. female presenting today for follow-up of allergic rhinoconjunctivitis, exercise-induced asthma, heat induced urticaria and hives and stinging insect allergy.  She was last seen in the office 11/07/2016 by myself. She reports she was doing well up until about 3 weeks ago she started having a lot of sinus congestion. She works at [employer] and reports she was around a bunch of camp children 3 weeks ago prior to onset of her symptoms.  She initially noted swollen lymph glands in her neck on the left side and then sinus symptoms followed.  Her symptoms include a lot of sinus congestion and pressure as well as drainage and ear pressure.  She states her symptoms have been much improved over the last 2 days. She denies any fevers that she was taking her temperature. She states she was doing warm salt water gargles as well as taking Sudafed and NyQuil. She remained on her usual medications of Singulair daily as well as Zyrtec daily. She was provided with a Dymista sample after her last visit but she ran out of this and had no nasal sprays to use however she does report using nasal saline spray times a day.     With her allergy she states she was doing well up until the past 3 weeks with her Singulair and daily Zyrtec. After her last visit she was going on several flights and she did use her dymista and reports that she did not get sick at all over her trips and she does feel like this nasal spray helped reduce her drainage.   With her asthma is mostly exercise-induced. She has a current hip injury and has not been able to run thus she reports she has not had any asthma symptoms or need to use her albuterol.  She is in physical therapy to help with her hip injury.    She did go to the beach this summer and reports she did develop her usual  hives with prolonged sun exposure however the hives were much less intense and less itchy with use of Atarax.   She carries her AuviQ with her at all times and avoid stinging insects as she has a hymenoptera sensitivity.  Review of systems: Review of Systems  Constitutional: Negative for chills (/), fever and malaise/fatigue.  HENT: Positive for congestion, ear pain, sinus pain and sore throat. Negative for ear discharge, hearing loss, nosebleeds and tinnitus.   Eyes: Negative for pain, discharge and redness.  Respiratory: Negative for cough, shortness of breath and wheezing.   Cardiovascular: Negative for chest pain.  Gastrointestinal: Negative for abdominal pain, constipation, diarrhea, heartburn, nausea and vomiting.  Musculoskeletal: Negative for joint pain.  Skin: Negative for itching and rash.  Neurological: Negative for headaches.    All other systems negative unless noted above in HPI  Past medical/social/surgical/family history have been reviewed and are unchanged unless specifically indicated below.  No changes  Medication List: Allergies as of 05/22/2017      Reactions   Ketoprofen Rash      Medication List       Accurate as of 05/22/17  1:28 PM. Always use your most recent med list.          albuterol 108 (90 Base) MCG/ACT inhaler Commonly known as:  VENTOLIN HFA Inhale 2 puffs into the lungs as needed (  for cough or wheeze).   B COMPLEX PO Take by mouth daily.   cetirizine 10 MG tablet Commonly known as:  ZYRTEC Take 10 mg by mouth daily.   hydrOXYzine 10 MG/5ML syrup Commonly known as:  ATARAX Take 5 mLs (10 mg total) by mouth 1 day or 1 dose. One teaspoonfull at night for itching   ibuprofen 800 MG tablet Commonly known as:  ADVIL,MOTRIN Take 1 tablet (800 mg total) by mouth every 8 (eight) hours as needed.   montelukast 10 MG tablet Commonly known as:  SINGULAIR Take one tablet each evening to prevent cough or wheeze.   norethindrone-ethinyl  estradiol 1-20 MG-MCG tablet Commonly known as:  JUNEL FE 1/20 Take 1 tablet by mouth daily.       Known medication allergies: Allergies  Allergen Reactions  . Ketoprofen Rash     Physical examination: Blood pressure 120/82, pulse 76, resp. rate 16.  General: Alert, interactive, in no acute distress. HEENT: PERRLA, TMs pearly gray, turbinates moderately edematous with clear discharge, post-pharynx non erythematous. No sinus TTP.  Neck: Supple without lymphadenopathy. Lungs: Clear to auscultation without wheezing, rhonchi or rales. {no increased work of breathing. CV: Normal S1, S2 without murmurs. Abdomen: Nondistended, nontender. Skin: Warm and dry, without lesions or rashes. Extremities:  No clubbing, cyanosis or edema. Neuro:   Grossly intact.  Diagnositics/Labs:  Spirometry: FEV1: 3.13L 100%, FVC: 3.61L  96%, ratio consistent with nonobstructive pattern  Assessment and plan:   Viral URI   - symptoms appear to be improving and advised continued supportive care and to let us know if symptoms 'double back' and worsen again.    Allergic rhinoconjunctivitis  - Continue Singulair 10 mg daily  - Provided with dymista sample use 1 spray twice a day to help with nasal drainage/post-nasal drip  - continue nasal saline rinse/spray   - continue Zyrtec daily  Exercise-induced asthma  - Well controlled at this time  - have access to albuterol inhaler 2 puffs every 4-6 hours as needed for cough/wheeze/shortness of breath/chest tightness.  May use 15-20 minutes prior to activity.   Monitor frequency of use.    Let us know if you are not meeting the below goals Asthma control goals:   Full participation in all desired activities (may need albuterol before activity)  Albuterol use two time or less a week on average (not counting use with activity)  Cough interfering with sleep two time or less a month  Oral steroids no more than once a year  No hospitalizations  Heat  induced urtcaria  - use Atarax for urticaria and itch control as needed  Hymenoptera hypersensitivity  - Continue avoidance of stinging insets and follow emergency action plan in case of reaction. Have access to [facility] for as needed use in case of severe reaction if stung  Follow-up in 12 months or sooner if needed.  I appreciate the opportunity to take part in [place] care. Please do not hesitate to contact me with questions.  Sincerely,   Margo Aye, MD Allergy/Immunology [facility]

## 2017-05-29 ENCOUNTER — Ambulatory Visit: Payer: BLUE CROSS/BLUE SHIELD | Admitting: Sports Medicine

## 2017-06-01 ENCOUNTER — Other Ambulatory Visit: Payer: Self-pay | Admitting: Allergy

## 2017-06-10 ENCOUNTER — Ambulatory Visit (INDEPENDENT_AMBULATORY_CARE_PROVIDER_SITE_OTHER): Payer: BLUE CROSS/BLUE SHIELD | Admitting: Sports Medicine

## 2017-06-10 VITALS — BP 124/88 | Ht 64.0 in | Wt 210.0 lb

## 2017-06-10 DIAGNOSIS — M25552 Pain in left hip: Secondary | ICD-10-CM

## 2017-06-10 NOTE — Progress Notes (Signed)
   [facility] [address] [place], [place] [address] Phone: [phone] Fax: [phone]   Patient Name: Toni Monroe Date of Birth: 1981-05-02 Medical Record Number: [id number] Gender: female Date of Encounter: 06/10/2017  History of Present Illness:  Toni Monroe is a 36 y.o. very pleasant female patient who presents for a follow up for L sided hip pain. Last seen by Dr. Margaretha Sheffieldraper on 04/24/17 and suspected to have possible labral tear. At that time was sent to [facility] for PT and recommended to continue with compression sleeve and 800mg  ibuprofen as needed.  She reports improved symptoms with home exercises, PT with Jonny RuizJohn and with Systems analystpersonal trainer. Has been working on lunges and adduction and internal rotation.  Able to go for 35 min walk on treadmill 2 weeks ago. Has not had time to try again since.  Has been pulling up flooring all weekend and now has some unrelated aches and pains in her thighs and knees.   Past Medical, Surgical, Social, and Family History Reviewed. Medications and Allergies reviewed and all updated if necessary.  Review of Systems: Denies numbness, tingling or weakness in LE  Physical Examination: Vitals:   06/10/17 0913  BP: 124/88   Vitals:   06/10/17 0913  Weight: 210 lb (95.3 kg)  Height: 5\' 4"  (1.626 m)   Body mass index is 36.05 kg/m.  General: well appearing 35yo F in NAD Cardiac: well perfused Resp: NWOB MSK: decreased ROM with internal rotation of L hip approx 25-30 degrees compared to 40 degrees in R hip. No TTP. Negative FADIR. Gait normal Neuro: grossly normal, no changes in sensation, strength 5/5 in LE throughout.   Assessment and Plan: Left hip pain Initially with concern for labral tear of L hip d/t decreased ROM and discomfort with internal rotation. Has shown improvement with PT, home exercises and as needed ibuprofen.  Today continues with decreased ROM, but pain has resolved.  Plans to continue PT and limiting lunges and returning to normal activities as pain allows.  - continue PT with [facility] - home exercises and PRN ibuprofen - follow up as needed

## 2017-06-10 NOTE — Assessment & Plan Note (Signed)
Initially with concern for labral tear of L hip d/t decreased ROM and discomfort with internal rotation. Has shown improvement with PT, home exercises and as needed ibuprofen.  Today continues with decreased ROM, but pain has resolved. Plans to continue PT and limiting lunges and returning to normal activities as pain allows.  - continue PT with [facility] - home exercises and PRN ibuprofen - follow up as needed

## 2017-06-11 NOTE — Addendum Note (Signed)
Addended by: Reino BellisRAPER, TIMOTHY R on: 06/11/2017 08:28 AM   Modules accepted: Level of Service

## 2017-06-19 IMAGING — CR DG HIP (WITH OR WITHOUT PELVIS) 2-3V*R*
2 series · 2 of 2 positions shown · non-contrast
Comparison: None.

CLINICAL DATA: Hip pain.  Running injury.

EXAM:
DG HIP (WITH OR WITHOUT PELVIS) 2-3V RIGHT

[w pelvis]
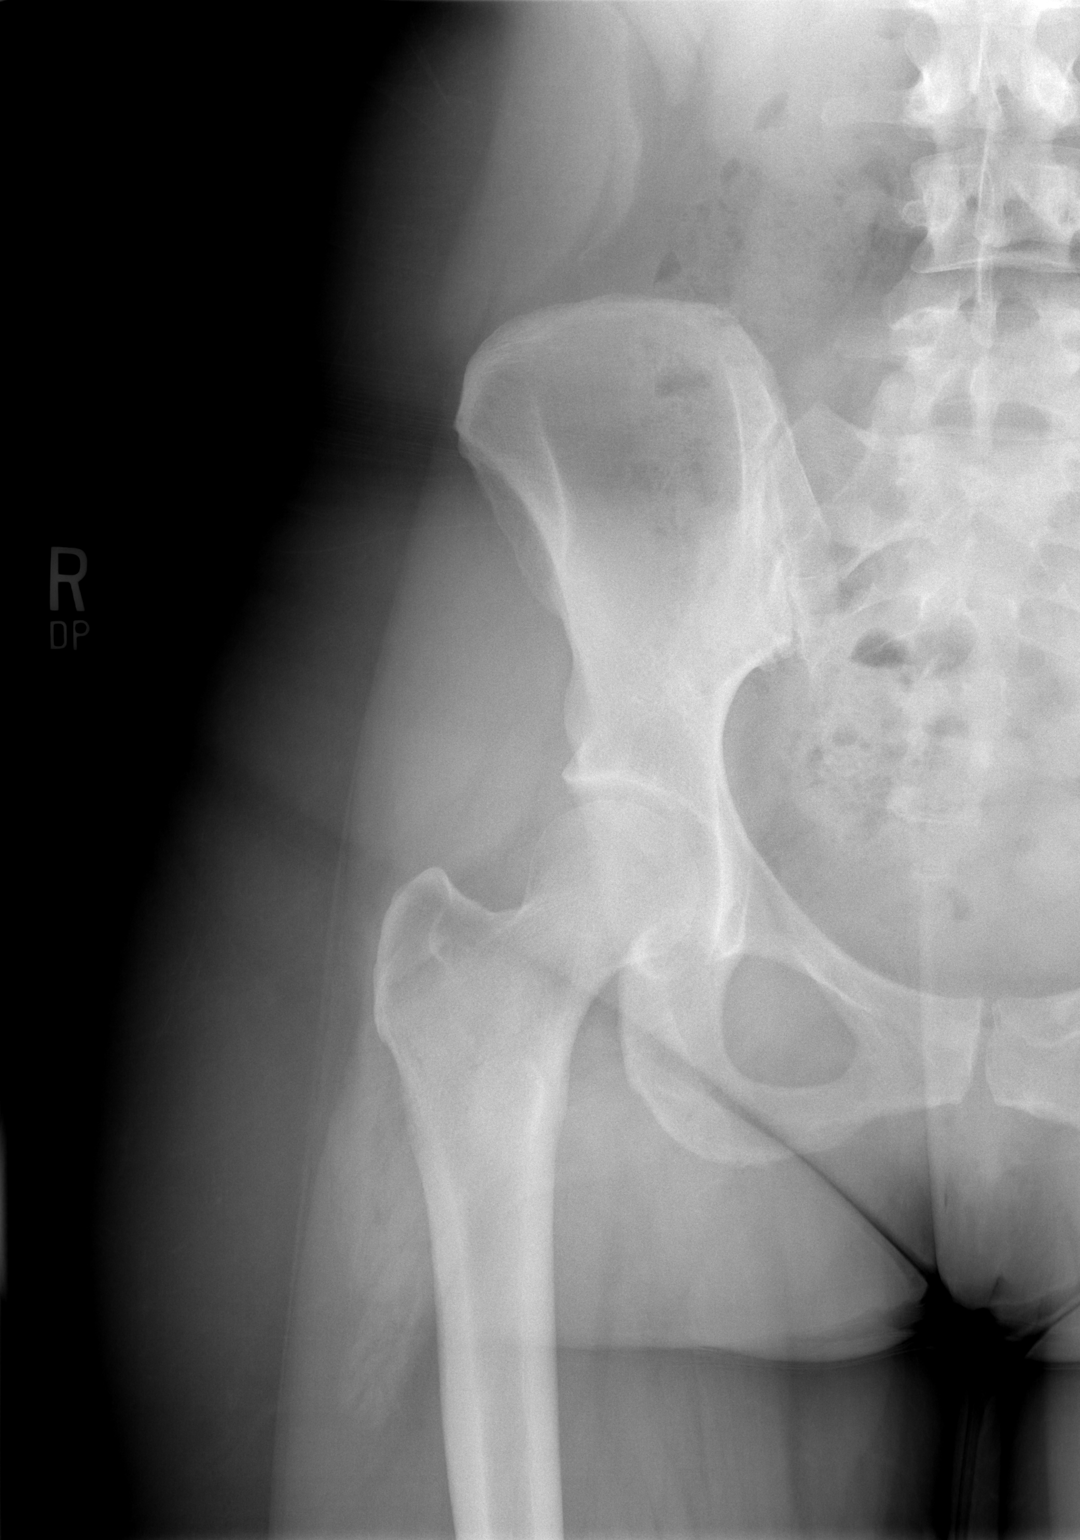

[t hip frog leg right]
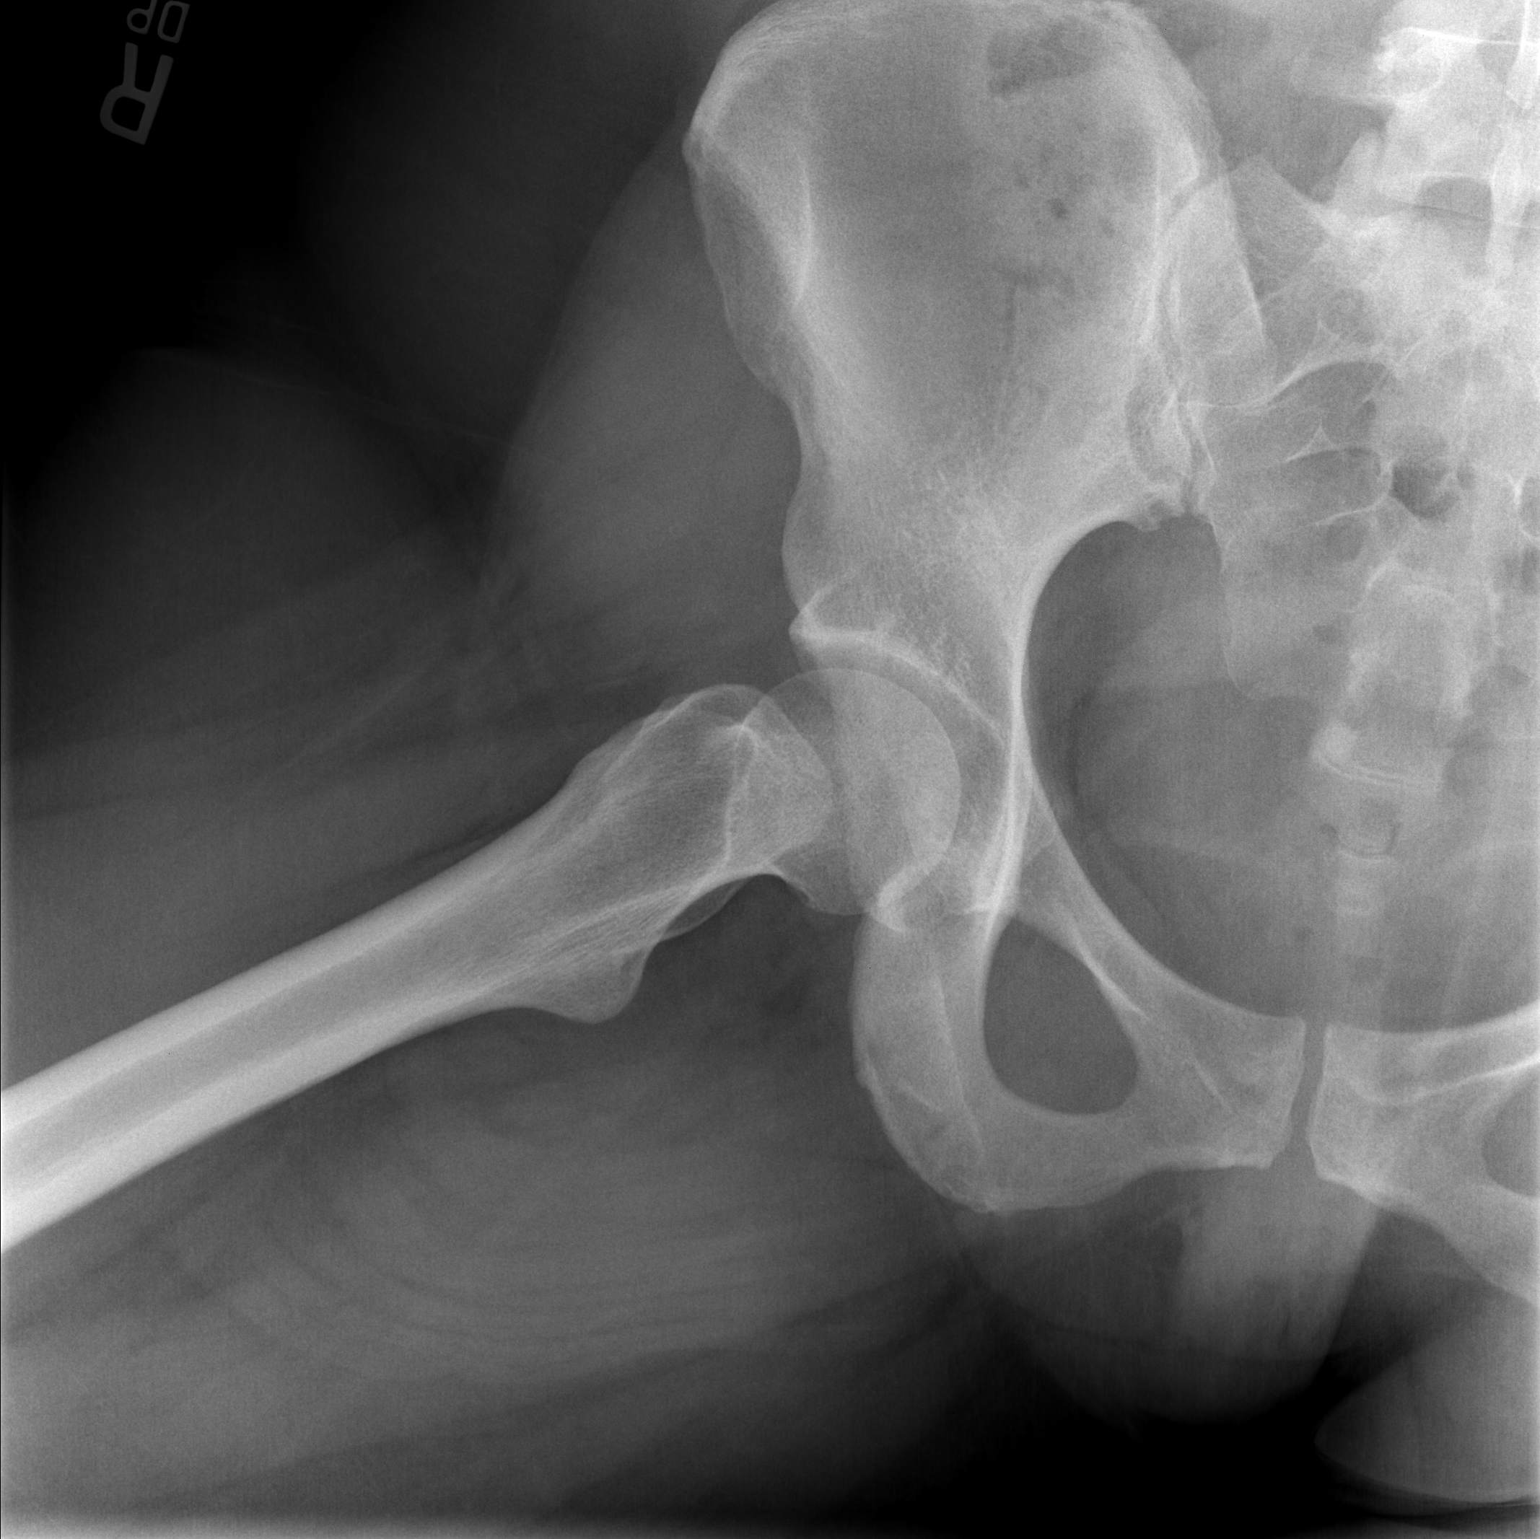

[2 of 2 positions shown; findings below may reference images not displayed]

FINDINGS: No acute bony or joint abnormality identified. No evidence of
fracture or dislocation.
IMPRESSION: No acute abnormality.

## 2017-06-26 ENCOUNTER — Encounter: Payer: Self-pay | Admitting: Certified Nurse Midwife

## 2017-06-26 ENCOUNTER — Ambulatory Visit (INDEPENDENT_AMBULATORY_CARE_PROVIDER_SITE_OTHER): Payer: BLUE CROSS/BLUE SHIELD | Admitting: Certified Nurse Midwife

## 2017-06-26 VITALS — BP 118/80 | HR 72 | Resp 16 | Ht 64.25 in | Wt 212.0 lb

## 2017-06-26 DIAGNOSIS — Z3041 Encounter for surveillance of contraceptive pills: Secondary | ICD-10-CM

## 2017-06-26 DIAGNOSIS — Z01419 Encounter for gynecological examination (general) (routine) without abnormal findings: Secondary | ICD-10-CM

## 2017-06-26 DIAGNOSIS — Z0000 Encounter for general adult medical examination without abnormal findings: Secondary | ICD-10-CM

## 2017-06-26 MED ORDER — NORETHIN ACE-ETH ESTRAD-FE 1-20 MG-MCG PO TABS: 1.0000 | ORAL_TABLET | Freq: Every day | ORAL | 5 refills | Status: DC

## 2017-06-26 NOTE — Progress Notes (Signed)
36 y.o. G0P0000 Single  Caucasian Fe here for annual exam. Periods normal, no issues. Contraception for cycle control only, working well. Migraine's less. Not sexually active. Working out still but less aggressive with walking and weight,TRX. Under PT now for hip injury which is working now. Busy with work.  No health issues today.  Patient's last menstrual period was 06/09/2017 (exact date).          Sexually active: No.  The current method of family planning is OCP (estrogen/progesterone).    Exercising: Yes.    walk, weights,trx Smoker:  no  Health Maintenance: Pap:  03-18-14 neg HPV HR neg, 06-21-16 neg HPV HR neg History of Abnormal Pap: no MMG:  none Self Breast exams: occ Colonoscopy:  none BMD:   none TDaP:  2017 Shingles: no Pneumonia: had done Hep C and HIV: HIV neg in the past after false positive when trying to donate blood Labs: will come for   reports that she has never smoked. She has never used smokeless tobacco. She reports that she drinks about 1.8 oz of alcohol per week . She reports that she does not use drugs.  Past Medical History:  Diagnosis Date  . Asthma 02/2007   allergys  . GERD (gastroesophageal reflux disease)   . Hypermobility of joint   . Menstrual migraine     Past Surgical History:  Procedure Laterality Date  . birth mark removal  57  . MOLE REMOVAL      Current Outpatient Prescriptions  Medication Sig Dispense Refill  . albuterol (VENTOLIN HFA) 108 (90 Base) MCG/ACT inhaler Inhale 2 puffs into the lungs every 4 (four) hours as needed (for cough or wheeze). 2 each 1  . B Complex Vitamins (B COMPLEX PO) Take by mouth daily.    . cetirizine (ZYRTEC) 10 MG tablet Take 10 mg by mouth daily.      Marland Kitchen ibuprofen (ADVIL,MOTRIN) 800 MG tablet Take 1 tablet (800 mg total) by mouth every 8 (eight) hours as needed. 30 tablet 1  . montelukast (SINGULAIR) 10 MG tablet Take one tablet each evening to prevent cough or wheeze. 30 tablet 5  .  norethindrone-ethinyl estradiol (JUNEL FE 1/20) 1-20 MG-MCG tablet Take 1 tablet by mouth daily. 3 Package 5   No current facility-administered medications for this visit.     Family History  Problem Relation Age of Onset  . Cancer Mother        lung  . Hyperlipidemia Mother   . Hypertension Father   . Glaucoma Father   . Cancer Maternal Grandmother        lymphatic  . Glaucoma Paternal Grandmother   . Hypertension Paternal Grandmother   . Diabetes Paternal Grandfather   . Hypertension Paternal Grandfather   . Heart disease Paternal Grandfather        heart attack  . Cancer Paternal Grandfather        melanoma  . Dementia Paternal Grandfather   . Heart disease Maternal Aunt        heart attack under age 68  . Lung cancer Maternal Aunt   . Brain cancer Maternal Aunt     ROS:  Pertinent items are noted in HPI.  Otherwise, a comprehensive ROS was negative.  Exam:   BP 118/80   Pulse 72   Resp 16   Ht 5' 4.25" (1.632 m)   Wt 212 lb (96.2 kg)   LMP 06/09/2017 (Exact Date)   BMI 36.11 kg/m  Height: 5' 4.25" (163.2  cm) Ht Readings from Last 3 Encounters:  06/26/17 5' 4.25" (1.632 m)  06/10/17  (1.626 m)  04/24/17  (1.626 m)    General appearance: alert, cooperative and appears stated age Head: Normocephalic, without obvious abnormality, atraumatic Neck: no adenopathy, supple, symmetrical, trachea midline and thyroid non-palpable Lungs: clear to auscultation bilaterally Breasts: normal appearance, no masses or tenderness, No nipple retraction or dimpling, No nipple discharge or bleeding, No axillary or supraclavicular adenopathy Heart: regular rate and rhythm Abdomen: soft, non-tender; no masses,  no organomegaly Extremities: extremities normal, atraumatic, no cyanosis or edema Skin: Skin color, texture, turgor normal. No rashes or lesions Lymph nodes: Cervical, supraclavicular, and axillary nodes normal. No abnormal inguinal nodes palpated Neurologic:  Grossly normal   Pelvic: External genitalia:  no lesions              Urethra:  normal appearing urethra with no masses, tenderness or lesions              Bartholin's and Skene's: normal                 Vagina: normal appearing vagina with normal color and discharge, no lesions              Cervix: no cervical motion tenderness, no lesions and nulliparous appearance              Pap taken: Yes.   Bimanual Exam:  Uterus:  normal size, contour, position, consistency, mobility, non-tender              Adnexa: normal adnexa and no mass, fullness, tenderness               Rectovaginal: Confirms               Anus:  normal sphincter tone, no lesions  Chaperone present: yes  A:  Well Woman with normal exam  Contraception OCP desired for cycle control  Migraine headache history no aura  Weight stable  Hip injury still in follow up with PT  P:   Reviewed health and wellness pertinent to exam  Rx Loestrin 1/20 Fe see order  Warning signs of migraine given  Continue with weight maintenance and follow up with PT  Schedule fasting labs: Vitamin D, Lipid panel  Pap smear: no   counseled on breast self exam, STD prevention, HIV risk factors and prevention, use and side effects of OCP's, adequate intake of calcium and vitamin D, diet and exercise  return annually or prn  An After Visit Summary was printed and given to the patient.

## 2017-06-26 NOTE — Patient Instructions (Signed)

## 2017-07-01 ENCOUNTER — Other Ambulatory Visit (INDEPENDENT_AMBULATORY_CARE_PROVIDER_SITE_OTHER): Payer: BLUE CROSS/BLUE SHIELD

## 2017-07-01 DIAGNOSIS — Z0000 Encounter for general adult medical examination without abnormal findings: Secondary | ICD-10-CM

## 2017-07-02 LAB — LIPID PANEL
Chol/HDL Ratio: 4.4 ratio (ref 0.0–4.4)
Cholesterol, Total: 184 mg/dL (ref 100–199)
HDL: 42 mg/dL (ref >39)
LDL Calculated: 121 mg/dL — ABNORMAL HIGH (ref 0–99)
TRIGLYCERIDES: 107 mg/dL (ref 0–149)
VLDL Cholesterol Cal: 21 mg/dL (ref 5–40)

## 2017-07-02 LAB — VITAMIN D 25 HYDROXY (VIT D DEFICIENCY, FRACTURES): Vit D, 25-Hydroxy: 45.2 ng/mL (ref 30.0–100.0)

## 2017-08-10 ENCOUNTER — Other Ambulatory Visit: Payer: Self-pay | Admitting: Certified Nurse Midwife

## 2017-08-10 DIAGNOSIS — Z3041 Encounter for surveillance of contraceptive pills: Secondary | ICD-10-CM

## 2017-08-11 NOTE — Telephone Encounter (Signed)
Medication refill request: OCP  Last AEX:  06-26-17  Next AEX: 07-08-18  Last MMG (if hormonal medication request): N/A Refill authorized: please advise

## 2017-11-10 ENCOUNTER — Telehealth: Payer: Self-pay | Admitting: Allergy

## 2017-11-10 DIAGNOSIS — H1010 Acute atopic conjunctivitis, unspecified eye: Secondary | ICD-10-CM

## 2017-11-10 DIAGNOSIS — J309 Allergic rhinitis, unspecified: Principal | ICD-10-CM

## 2017-11-10 MED ORDER — MONTELUKAST SODIUM 10 MG PO TABS: ORAL_TABLET | ORAL | 5 refills | Status: DC

## 2017-11-10 NOTE — Addendum Note (Signed)
Addended by: Mliss FritzBLACK, Rodderick Holtzer I on: 11/10/2017 02:58 PM   Modules accepted: Orders

## 2017-11-10 NOTE — Telephone Encounter (Signed)
Pt called and needs to have singulair called into [facility] here is there number [phone]. [phone].

## 2018-07-01 ENCOUNTER — Encounter: Payer: Self-pay | Admitting: Certified Nurse Midwife

## 2018-07-01 ENCOUNTER — Ambulatory Visit (INDEPENDENT_AMBULATORY_CARE_PROVIDER_SITE_OTHER): Payer: PRIVATE HEALTH INSURANCE | Admitting: Certified Nurse Midwife

## 2018-07-01 ENCOUNTER — Other Ambulatory Visit: Payer: Self-pay

## 2018-07-01 ENCOUNTER — Other Ambulatory Visit: Payer: Self-pay | Admitting: Allergy

## 2018-07-01 ENCOUNTER — Encounter: Payer: Self-pay | Admitting: Allergy

## 2018-07-01 ENCOUNTER — Ambulatory Visit: Payer: PRIVATE HEALTH INSURANCE | Admitting: Allergy

## 2018-07-01 VITALS — BP 110/70 | HR 68 | Resp 16 | Ht 64.5 in | Wt 223.0 lb

## 2018-07-01 VITALS — BP 118/88 | HR 70 | Temp 98.3°F | Resp 18 | Ht 63.0 in | Wt 223.2 lb

## 2018-07-01 DIAGNOSIS — J309 Allergic rhinitis, unspecified: Secondary | ICD-10-CM

## 2018-07-01 DIAGNOSIS — Z3041 Encounter for surveillance of contraceptive pills: Secondary | ICD-10-CM

## 2018-07-01 DIAGNOSIS — Z01419 Encounter for gynecological examination (general) (routine) without abnormal findings: Secondary | ICD-10-CM

## 2018-07-01 DIAGNOSIS — Z91038 Other insect allergy status: Secondary | ICD-10-CM

## 2018-07-01 DIAGNOSIS — Z91030 Bee allergy status: Secondary | ICD-10-CM

## 2018-07-01 DIAGNOSIS — J45990 Exercise induced bronchospasm: Secondary | ICD-10-CM

## 2018-07-01 DIAGNOSIS — E663 Overweight: Secondary | ICD-10-CM

## 2018-07-01 DIAGNOSIS — Z872 Personal history of diseases of the skin and subcutaneous tissue: Secondary | ICD-10-CM | POA: Diagnosis not present

## 2018-07-01 DIAGNOSIS — H1010 Acute atopic conjunctivitis, unspecified eye: Secondary | ICD-10-CM

## 2018-07-01 MED ORDER — EPINEPHRINE 0.3 MG/0.3ML IJ SOAJ: 0.3000 mg | Freq: Once | INTRAMUSCULAR | 2 refills | Status: AC

## 2018-07-01 MED ORDER — AZELASTINE-FLUTICASONE 137-50 MCG/ACT NA SUSP: 1.0000 | Freq: Two times a day (BID) | NASAL | 5 refills | Status: DC

## 2018-07-01 MED ORDER — OLOPATADINE HCL 0.2 % OP SOLN: 1.0000 [drp] | Freq: Every day | OPHTHALMIC | 5 refills | Status: DC | PRN

## 2018-07-01 MED ORDER — NORETHIN ACE-ETH ESTRAD-FE 1-20 MG-MCG PO TABS: 1.0000 | ORAL_TABLET | Freq: Every day | ORAL | 4 refills | Status: DC

## 2018-07-01 MED ORDER — MONTELUKAST SODIUM 10 MG PO TABS: ORAL_TABLET | ORAL | 5 refills | Status: DC

## 2018-07-01 NOTE — Progress Notes (Signed)
37 y.o. G0P0000 Single  Caucasian Fe here for annual exam. Contraception OCP for cycle control, working well. Not sexually active. 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 Happy with choice. Periods normal , no issues. Sees Urgent care if needed. Aware of weight gain of 12 pounds with personal trainer and exercise, meal delivery service. No health issues. Has labs with health and wellness with [facility]. Will send copy.  No LMP recorded. (Menstrual status: Oral contraceptives).          Sexually active: No.  The current method of family planning is OCP (estrogen/progesterone).    Exercising: Yes.    weights & cardio Smoker:  no  Review of Systems  Constitutional:       Weight gain  HENT: Negative.   Eyes: Negative.   Respiratory: Negative.   Cardiovascular: Negative.   Gastrointestinal: Negative.   Genitourinary: Negative.   Musculoskeletal: Negative.   Skin: Negative.   Neurological: Negative.   Endo/Heme/Allergies: Negative.   Psychiatric/Behavioral: Negative.     Health Maintenance: Pap:  06-21-16 neg HPV HR neg History of Abnormal Pap: no MMG:  none Self Breast exams: occ Colonoscopy:  none BMD:   none TDaP:  2017 Shingles: no Pneumonia: had done Hep C and HIV: HIV neg in the past after false positive when trying to donate blood Labs: if needed   reports that she has never smoked. She has never used smokeless tobacco. She reports that she drinks about 3.0 standard drinks of alcohol per week. She reports that she does not use drugs.  Past Medical History:  Diagnosis Date  . Asthma 02/2007   allergys  . GERD (gastroesophageal reflux disease)   . Hypermobility of joint   . Menstrual migraine     Past Surgical History:  Procedure Laterality Date  . birth mark removal  14  . MOLE REMOVAL      Current Outpatient Medications  Medication Sig Dispense Refill  . albuterol (VENTOLIN HFA) 108 (90 Base) MCG/ACT inhaler Inhale 2 puffs into the lungs  every 4 (four) hours as needed (for cough or wheeze). 2 each 1  . B Complex Vitamins (B COMPLEX PO) Take by mouth daily.    . cetirizine (ZYRTEC) 10 MG tablet Take 10 mg by mouth daily.      Marland Kitchen ibuprofen (ADVIL,MOTRIN) 800 MG tablet Take 1 tablet (800 mg total) by mouth every 8 (eight) hours as needed. 30 tablet 1  . JUNEL FE 1/20 1-20 MG-MCG tablet TAKE 1 ACTIVE TABLET CONTINUOUSLY. 84 tablet 4  . montelukast (SINGULAIR) 10 MG tablet Take one tablet each evening to prevent cough or wheeze. 30 tablet 5   No current facility-administered medications for this visit.     Family History  Problem Relation Age of Onset  . Cancer Mother        lung  . Hyperlipidemia Mother   . Other Mother        soft tissue sarcoma of rt thigh  . Hypertension Father   . Glaucoma Father   . Cancer Maternal Grandmother        lymphatic  . Glaucoma Paternal Grandmother   . Hypertension Paternal Grandmother   . Diabetes Paternal Grandfather   . Hypertension Paternal Grandfather   . Heart disease Paternal Grandfather        heart attack  . Cancer Paternal Grandfather        melanoma  . Dementia Paternal Grandfather   . Heart disease Maternal Aunt  heart attack under age 52  . Lung cancer Maternal Aunt   . Brain cancer Maternal Aunt     ROS:  Pertinent items are noted in HPI.  Otherwise, a comprehensive ROS was negative.  Exam:   There were no vitals taken for this visit.   Ht Readings from Last 3 Encounters:  06/26/17 5' 4.25" (1.632 m)  06/10/17 5\' 4"  (1.626 m)  04/24/17 5\' 4"  (1.626 m)    General appearance: alert, cooperative and appears stated age Head: Normocephalic, without obvious abnormality, atraumatic Neck: no adenopathy, supple, symmetrical, trachea midline and thyroid normal to inspection and palpation Lungs: clear to auscultation bilaterally Breasts: normal appearance, no masses or tenderness, No nipple retraction or dimpling, No nipple discharge or bleeding, No axillary or  supraclavicular adenopathy Heart: regular rate and rhythm Abdomen: soft, non-tender; no masses,  no organomegaly Extremities: extremities normal, atraumatic, no cyanosis or edema Skin: Skin color, texture, turgor normal. No rashes or lesions Lymph nodes: Cervical, supraclavicular, and axillary nodes normal. No abnormal inguinal nodes palpated Neurologic: Grossly normal   Pelvic: External genitalia:  no lesions              Urethra:  normal appearing urethra with no masses, tenderness or lesions              Bartholin's and Skene's: normal                 Vagina: normal appearing vagina with normal color and discharge, no lesions              Cervix: no cervical motion tenderness, no lesions, nulliparous appearance and scant blood noted              Pap taken: No. Bimanual Exam:  Uterus:  normal size, contour, position, consistency, mobility, non-tender and anteverted              Adnexa: normal adnexa and no mass, fullness, tenderness               Rectovaginal: Confirms               Anus:  normal sphincter tone, no lesions  Chaperone present: yes  A:  Well Woman with normal exam  Contraception OCP for cycle control only, working well.  Not sexually active ever.  Overweight on weight loss program with trainer now  Labs with health wellness at [facility] P:   Reviewed health and wellness pertinent to exam  Risks/benefits/warning signs of OCP given , desires continuance  Rx Junel 1/20 Fe see order with instructions  Continue weight loss journey for improved health   Pap smear: no   counseled on breast self exam, STD prevention, HIV risk factors and prevention, use and side effects of OCP's  return annually or prn  An After Visit Summary was printed and given to the patient.

## 2018-07-01 NOTE — Patient Instructions (Signed)

## 2018-07-01 NOTE — Patient Instructions (Addendum)
Allergic rhinoconjunctivitis  - Continue Singulair 10 mg daily  - Provided with dymista sample use 1 spray twice a day to help with nasal drainage/post-nasal drip  - continue nasal saline rinse/spray   - will have you trial Xyzal 5mg  daily to replace Zyrtec.  If Xyzal works better for you then continue.  Provided with Xyzal   - for itchy/watery/red eyes use Olopatadine 0.2% 1 drop each eye daily as needed.  Provided with Pazeo sample today.    Exercise-induced asthma  - Well controlled at this time  - have access to albuterol inhaler 2 puffs every 4-6 hours as needed for cough/wheeze/shortness of breath/chest tightness.  May use 15-20 minutes prior to activity.   Monitor frequency of use.    Let us know if you are not meeting the below goals Asthma control goals:   Full participation in all desired activities (may need albuterol before activity)  Albuterol use two time or less a week on average (not counting use with activity)  Cough interfering with sleep two time or less a month  Oral steroids no more than once a year  No hospitalizations  Hymenoptera hypersensitivity  - Continue avoidance of stinging insects and follow emergency action plan in case of reaction. Have access to Epipen or AuviQ for as needed use in case of severe reaction if stung  Follow-up in 12 months or sooner if needed.

## 2018-07-01 NOTE — Progress Notes (Signed)
Follow-up Note  RE: Toni Monroe MRN: [id number] DOB: 07-Nov-1980 Date of Office Visit: 07/01/2018   History of present illness: Toni Monroe is a 37 y.o. female presenting today for follow-up of allergic rhinoconjunctivitis, exercise induced asthma, hymenoptera hypersensitivity and urticaria.  She was last seen in the office on May 22, 2017 by myself.  She states she has had a good year without any major health changes, surgeries or hospitalizations.  She states that she has been having some issues with her eyes that have occurred every year around this time work they are get really itchy and red.  They are worse at night and when she is at home.  She did see her eye doctor last year for this and was prescribed a 10-day course of an eyedrop that she says was clear and slightly thick.  She does not recall what this is but states that it did help.  She has not had an eyedrop that she can use as needed for allergy symptom relief.  She is taking singular daily and states that Dymista works well for her for nasal congestion and drainage.  She also takes Zyrtec daily.  She will perform nasal saline rinses periodically.  She denies any significant issues with her breathing and states that she does not always use albuterol prior to activity but if she wishes to be doing like long distance running that she will use albuterol prior.  She has not required any ED or urgent care visits for respiratory issues and has not had any oral steroids in the past year.  She also denies any sting's or need to use an epinephrine device for reactions.  She has not had any further issues with urticaria at this time.  Review of systems: Review of Systems  Constitutional: Negative for chills, fever and malaise/fatigue.  HENT: Negative for congestion, ear discharge, ear pain, nosebleeds and sore throat.   Eyes: Positive for redness. Negative for pain and discharge.  Respiratory: Negative for cough, shortness  of breath and wheezing.   Cardiovascular: Negative for chest pain.  Gastrointestinal: Negative for abdominal pain, constipation, diarrhea, heartburn, nausea and vomiting.  Musculoskeletal: Negative for joint pain.  Skin: Negative for itching and rash.  Neurological: Negative for headaches.    All other systems negative unless noted above in HPI  Past medical/social/surgical/family history have been reviewed and are unchanged unless specifically indicated below.  No changes  Medication List: Allergies as of 07/01/2018      Reactions   Ketoprofen Rash      Medication List        Accurate as of 07/01/18  5:09 PM. Always use your most recent med list.          albuterol 108 (90 Base) MCG/ACT inhaler Commonly known as:  PROVENTIL HFA;VENTOLIN HFA Inhale 2 puffs into the lungs every 4 (four) hours as needed (for cough or wheeze).   B COMPLEX PO Take by mouth daily.   cetirizine 10 MG tablet Commonly known as:  ZYRTEC Take 10 mg by mouth daily.   diphenhydrAMINE 25 mg capsule Commonly known as:  BENADRYL Take by mouth.   diphenhydramine-acetaminophen 25-500 MG Tabs tablet Commonly known as:  TYLENOL PM Take 1 tablet by mouth at bedtime as needed.   ibuprofen 800 MG tablet Commonly known as:  ADVIL,MOTRIN Take 1 tablet (800 mg total) by mouth every 8 (eight) hours as needed.   montelukast 10 MG tablet Commonly known as:  SINGULAIR Take one  tablet each evening to prevent cough or wheeze.   norethindrone-ethinyl estradiol 1-20 MG-MCG tablet Commonly known as:  JUNEL FE,GILDESS FE,LOESTRIN FE Take 1 tablet by mouth daily.       Known medication allergies: Allergies  Allergen Reactions  . Ketoprofen Rash     Physical examination: Blood pressure 118/88, pulse 70, temperature 98.3 F (36.8 C), temperature source Oral, resp. rate 18, height 5\' 3"  (1.6 m), weight 223 lb 3.2 oz (101.2 kg), SpO2 99 %.  General: Alert, interactive, in no acute distress. HEENT:  PERRLA, TMs pearly gray, turbinates non-edematous without discharge, post-pharynx non erythematous. Neck: Supple without lymphadenopathy. Lungs: Clear to auscultation without wheezing, rhonchi or rales. {no increased work of breathing. CV: Normal S1, S2 without murmurs. Abdomen: Nondistended, nontender. Skin: Warm and dry, without lesions or rashes. Extremities:  No clubbing, cyanosis or edema. Neuro:   Grossly intact.  Diagnositics/Labs:  Spirometry: FEV1: 2.99L 100%, FVC: 3.54L 98%, ratio consistent with Nonobstructive pattern  Assessment and plan:   Allergic rhinoconjunctivitis  - Continue Singulair 10 mg daily  - Provided with dymista sample use 1 spray twice a day to help with nasal drainage/post-nasal drip  - continue nasal saline rinse/spray   -  will have you trial Xyzal 5mg  daily to replace Zyrtec.  If Xyzal works better for you then continue.  Provided with Xyzal   - for itchy/watery/red eyes use Olopatadine 0.2% 1 drop each eye daily as needed.  Provided with Pazeo sample today.    Exercise-induced asthma  - Well controlled at this time  - have access to albuterol inhaler 2 puffs every 4-6 hours as needed for cough/wheeze/shortness of breath/chest tightness.  May use 15-20 minutes prior to activity.   Monitor frequency of use.    Let us know if you are not meeting the below goals Asthma control goals:   Full participation in all desired activities (may need albuterol before activity)  Albuterol use two time or less a week on average (not counting use with activity)  Cough interfering with sleep two time or less a month  Oral steroids no more than once a year  No hospitalizations  Hymenoptera hypersensitivity  - Continue avoidance of stinging insects and follow emergency action plan in case of reaction. Have access to Epipen or AuviQ for as needed use in case of severe reaction if stung  Follow-up in 12 months or sooner if needed.  I appreciate the opportunity  to take part in [place] care. Please do not hesitate to contact me with questions.  Sincerely,   Margo Aye, MD Allergy/Immunology [facility]

## 2018-07-08 ENCOUNTER — Ambulatory Visit: Payer: BLUE CROSS/BLUE SHIELD | Admitting: Certified Nurse Midwife

## 2018-10-09 DIAGNOSIS — J45990 Exercise induced bronchospasm: Secondary | ICD-10-CM | POA: Insufficient documentation

## 2018-10-12 ENCOUNTER — Other Ambulatory Visit: Payer: Self-pay | Admitting: Allergy

## 2018-10-12 DIAGNOSIS — J4530 Mild persistent asthma, uncomplicated: Secondary | ICD-10-CM

## 2018-10-12 MED ORDER — ALBUTEROL SULFATE HFA 108 (90 BASE) MCG/ACT IN AERS: 2.0000 | INHALATION_SPRAY | RESPIRATORY_TRACT | 1 refills | Status: DC | PRN

## 2018-10-12 NOTE — Telephone Encounter (Signed)
Patient is requesting a refill on Ventolin. She said she contacted her pharmacy and they told her it had been deleted from her file and Dr. Delorse LekPadgett needed to add it back. [facility] Last seen 07-01-18.

## 2018-10-12 NOTE — Telephone Encounter (Signed)
Sent script into pharmacy. 

## 2018-10-27 DIAGNOSIS — R030 Elevated blood-pressure reading, without diagnosis of hypertension: Secondary | ICD-10-CM | POA: Insufficient documentation

## 2018-10-27 DIAGNOSIS — I10 Essential (primary) hypertension: Secondary | ICD-10-CM | POA: Insufficient documentation

## 2018-12-16 ENCOUNTER — Encounter: Payer: Self-pay | Admitting: Certified Nurse Midwife

## 2018-12-18 IMAGING — DX DG HIP (WITH OR WITHOUT PELVIS) 2-3V*L*
2 series · 2 of 2 positions shown · non-contrast
Comparison: None.

CLINICAL DATA: Pain, chronic

EXAM:
DG HIP (WITH OR WITHOUT PELVIS) 2-3V LEFT

[dg hip unilat w or w/o pelvis 2-3 views  (1 of 2)]
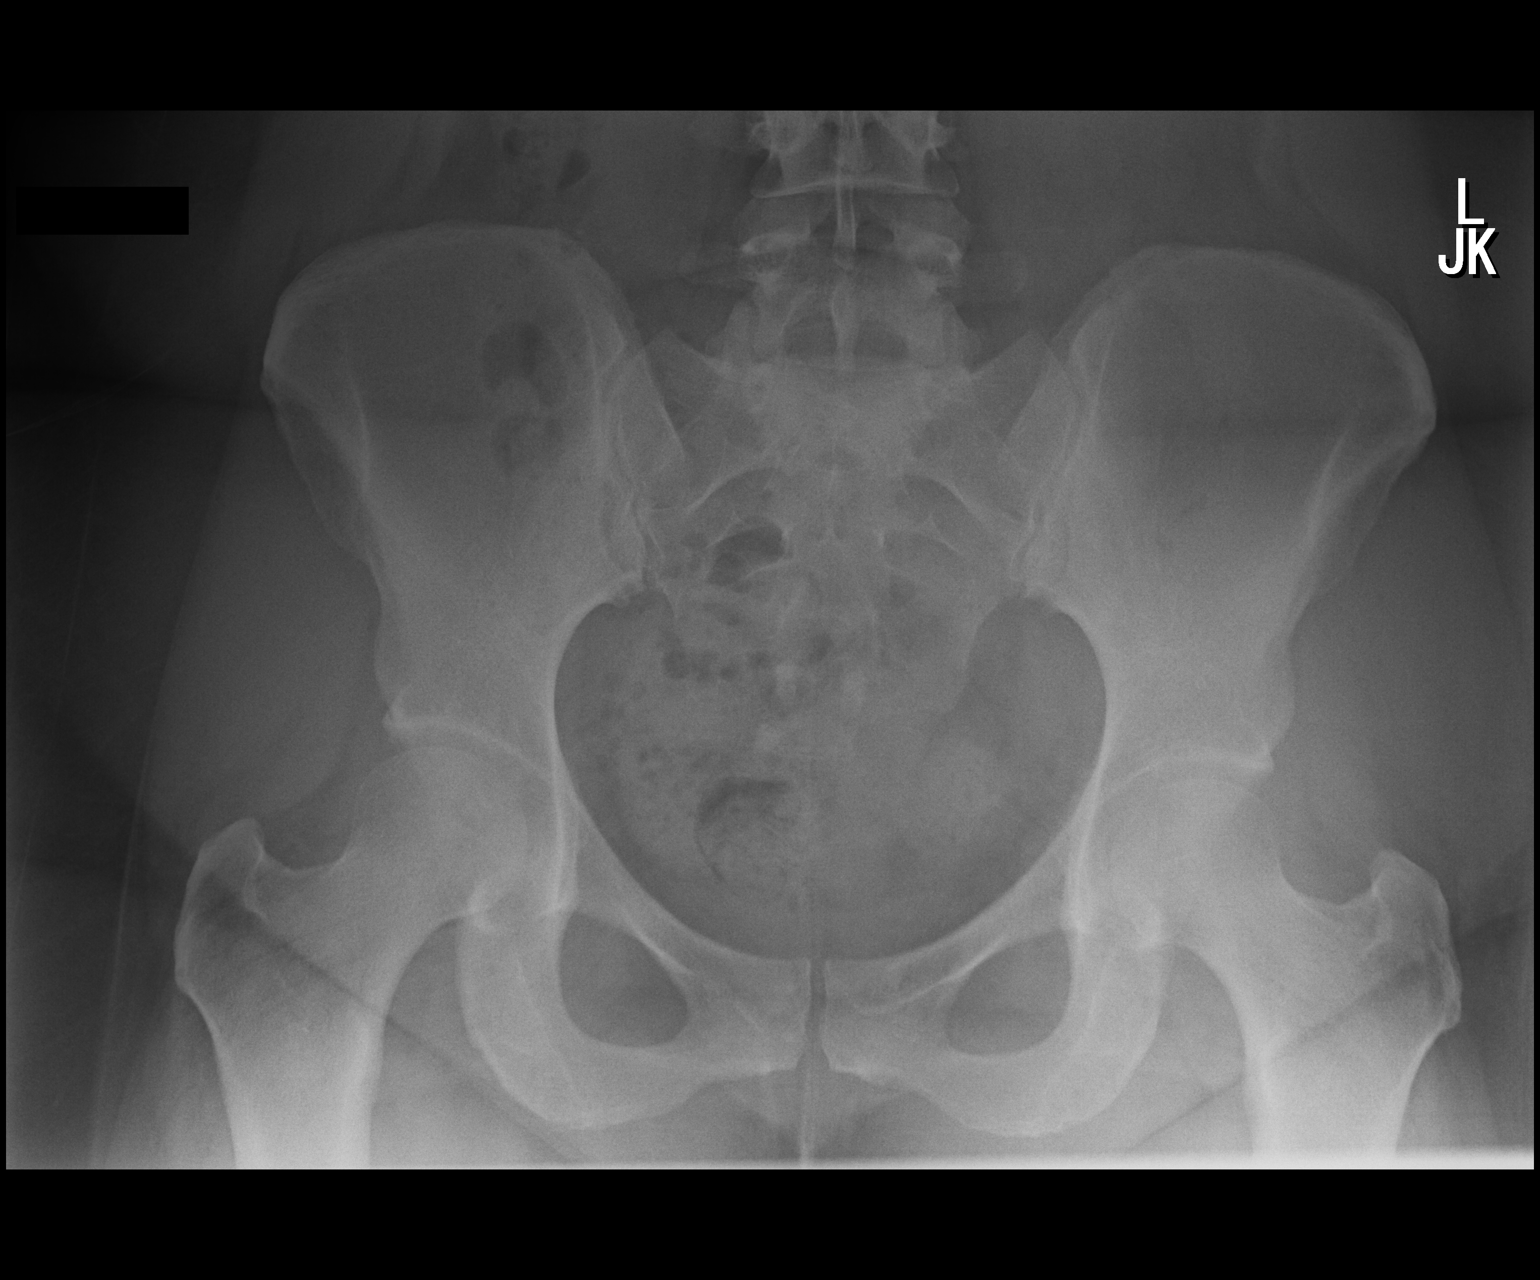

[dg hip unilat w or w/o pelvis 2-3 views  (2 of 2)]
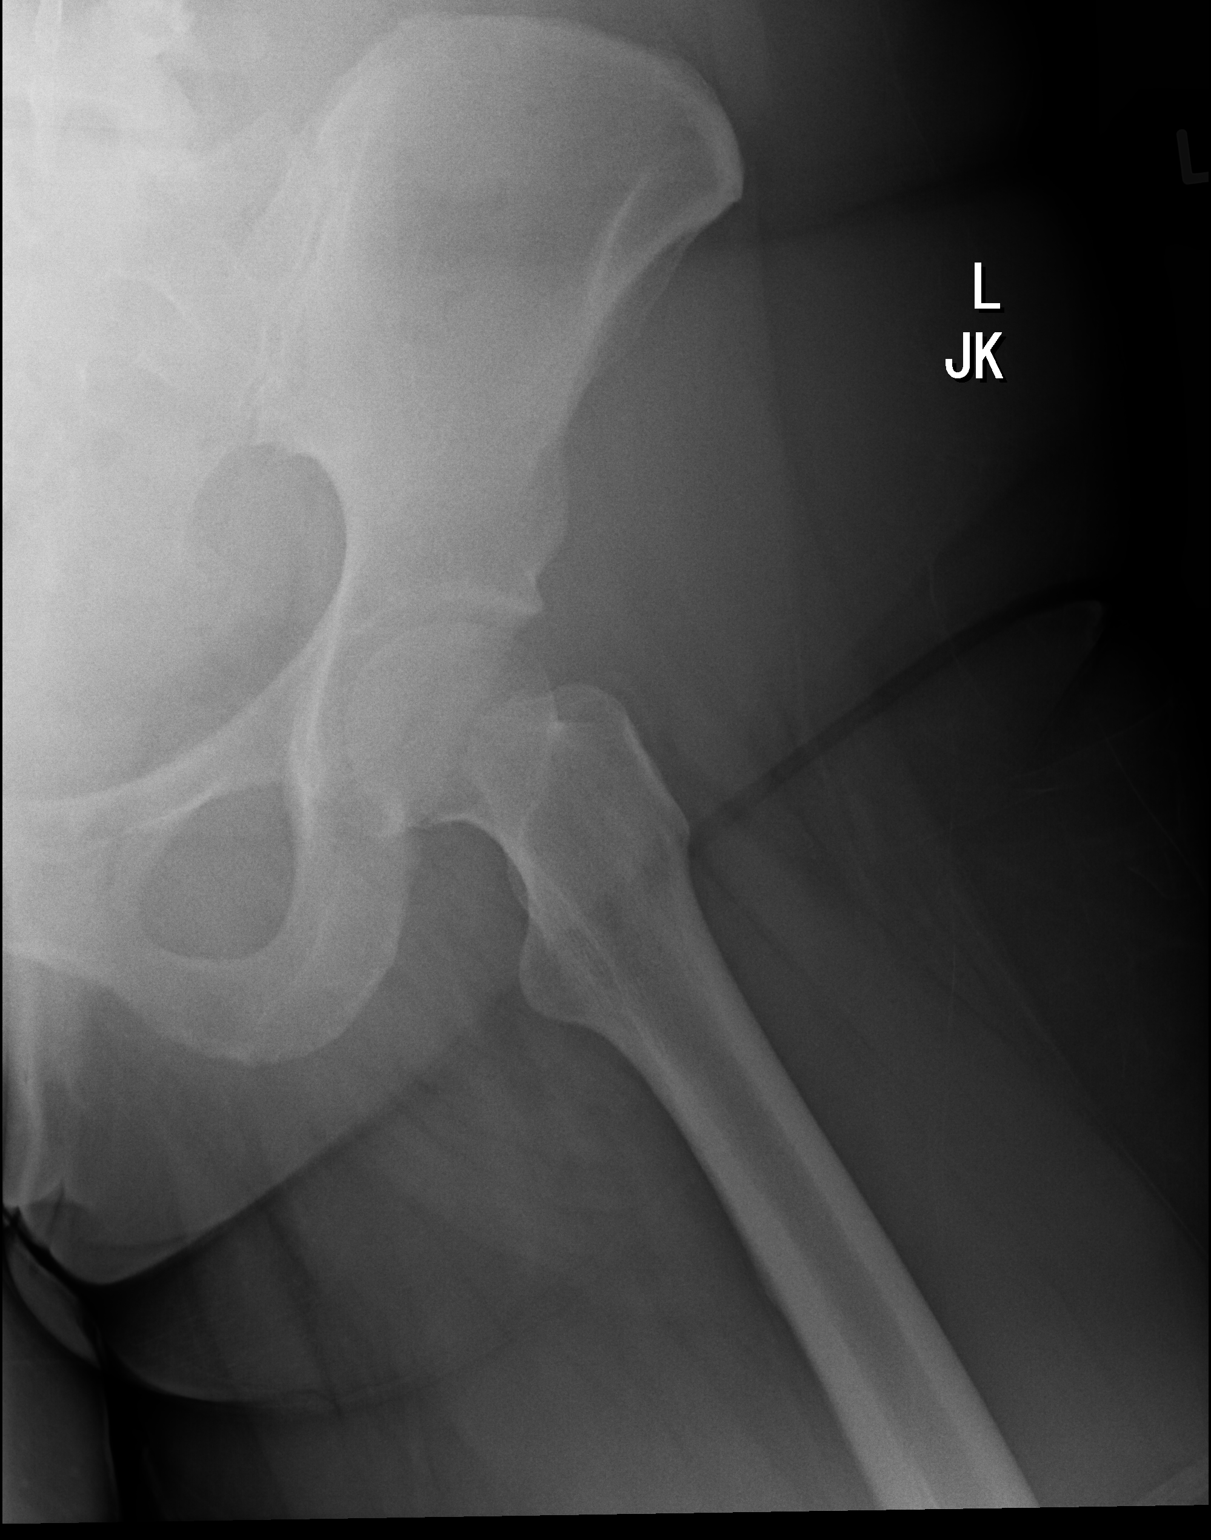

[2 of 2 positions shown; findings below may reference images not displayed]

FINDINGS: Standing frontal pelvis and lateral left hip images were obtained.
No fracture or dislocation. Joint spaces appear normal. No erosive
change.
IMPRESSION: No fracture or dislocation.  No evident arthropathy.

## 2018-12-24 ENCOUNTER — Other Ambulatory Visit: Payer: Self-pay | Admitting: Allergy

## 2018-12-24 DIAGNOSIS — H1010 Acute atopic conjunctivitis, unspecified eye: Secondary | ICD-10-CM

## 2018-12-24 DIAGNOSIS — J309 Allergic rhinitis, unspecified: Principal | ICD-10-CM

## 2018-12-25 ENCOUNTER — Telehealth: Payer: Self-pay

## 2018-12-25 NOTE — Telephone Encounter (Signed)
Refill sent in for montelukast

## 2019-02-18 ENCOUNTER — Telehealth: Payer: Self-pay | Admitting: Certified Nurse Midwife

## 2019-02-18 NOTE — Telephone Encounter (Signed)
Patient returning call. States a response via mychart is the best way to get back in touch with her.

## 2019-02-18 NOTE — Telephone Encounter (Signed)
Left message to call Deltha Bernales at [phone]. 

## 2019-02-18 NOTE — Telephone Encounter (Signed)
Patient sent the following message through MyChart. Routing to triage to assist patient with request.   Hi [name]    I am having a Menstrual migraine. I'm almost out of Excedrin migraine and discovering that apparently they're not making it right now. Do you have a recommendation of what I should use in place of it?    Thanks  Toni Monroe

## 2019-02-18 NOTE — Telephone Encounter (Signed)
Patient is returning a call to Kaitlyn. °

## 2019-02-18 NOTE — Telephone Encounter (Signed)
Left message to call Kaitlyn at [phone]. 

## 2019-02-19 NOTE — Telephone Encounter (Signed)
Spoke with patient. Message given as seen below from Dr.Silva. Patient verbalizes understanding. Would like to think about this before making a decision.  Routing to provider and will close encounter.

## 2019-02-19 NOTE — Telephone Encounter (Signed)
She can take her birth control pills continuously and not take reminder pills at all, so she does not experience a drop in her estrogen level. This will help her to avoid a menstrual period and then the menstrual migraine.  If she would like to discuss alternatives to this with additional prescription of an estrogen patch during her week of usual menstruation, please have her make an appointment.

## 2019-02-19 NOTE — Telephone Encounter (Signed)
Spoke with patient. Patient states that she is on continuous active Junel Fe 1/20. Takes this continuously for 3 months and then has a menses. States that when she has her menses she gets really bad headaches, which is why she takes continuous active pills. States that she usually takes Excedrin migraine for headache relief, but found out they are no longer makes Excedrin migraine due to quality control issues. States that 800 mg of Ibuprofen does not help. Only has headaches/migraines when she is on her off week of pills and having her cycle. Does not have auras. Asking for further recommendations on what she can use. Requests a mychart message be sent with recommendations.

## 2019-07-02 ENCOUNTER — Other Ambulatory Visit: Payer: Self-pay

## 2019-07-02 ENCOUNTER — Ambulatory Visit: Payer: PRIVATE HEALTH INSURANCE | Admitting: Allergy

## 2019-07-02 ENCOUNTER — Encounter: Payer: Self-pay | Admitting: Allergy

## 2019-07-02 VITALS — BP 152/112 | HR 93 | Temp 98.3°F | Resp 18 | Ht 64.0 in | Wt 234.2 lb

## 2019-07-02 DIAGNOSIS — H1013 Acute atopic conjunctivitis, bilateral: Secondary | ICD-10-CM

## 2019-07-02 DIAGNOSIS — Z91030 Bee allergy status: Secondary | ICD-10-CM | POA: Diagnosis not present

## 2019-07-02 DIAGNOSIS — Z91038 Other insect allergy status: Secondary | ICD-10-CM

## 2019-07-02 DIAGNOSIS — J3089 Other allergic rhinitis: Secondary | ICD-10-CM

## 2019-07-02 DIAGNOSIS — J45990 Exercise induced bronchospasm: Secondary | ICD-10-CM | POA: Diagnosis not present

## 2019-07-02 MED ORDER — MONTELUKAST SODIUM 10 MG PO TABS: ORAL_TABLET | ORAL | 5 refills | Status: DC

## 2019-07-02 MED ORDER — OLOPATADINE HCL 0.2 % OP SOLN: 1.0000 [drp] | Freq: Every day | OPHTHALMIC | 5 refills | Status: DC | PRN

## 2019-07-02 MED ORDER — AZELASTINE-FLUTICASONE 137-50 MCG/ACT NA SUSP: 1.0000 | Freq: Two times a day (BID) | NASAL | 5 refills | Status: DC

## 2019-07-02 MED ORDER — EPINEPHRINE 0.3 MG/0.3ML IJ SOAJ: INTRAMUSCULAR | 3 refills | Status: DC

## 2019-07-02 MED ORDER — ALBUTEROL SULFATE HFA 108 (90 BASE) MCG/ACT IN AERS: 2.0000 | INHALATION_SPRAY | RESPIRATORY_TRACT | 1 refills | Status: DC | PRN

## 2019-07-02 NOTE — Progress Notes (Signed)
Follow-up Note  RE: Toni Monroe MRN: [id number] DOB: 17-Aug-1981 Date of Office Visit: 07/02/2019   History of present illness: Toni Monroe is a 38 y.o. female presenting today for follow-up of allergic rhinitis with conjunctivitis, exercise induced asthma and hymenoptera hypersensitivity.  She was last seen in the office on 07/01/18 by myself.    She reports she only used her albuterol when she had a respiratory illness in Jan 2020.  She is sure she and her family had Covid after taking a family trip to [place].  She states UC told her she had a viral bronchitis.  The family all did recover from this illness.    Fall is typically her worse season for her so she does report some nasal drainage.  She uses Dymista when she has drainage which does help.  She takes Xyzal as needed typically moreso during fall.   She saw an eye doctor this summer due to dry eye and was started on restasis which has helped.   She has not had any stings or reactions and does have access to her epinephrine device.   She has had a stressful year and reports that her BP has been elevated since she was sick in January.  She reports she has addressed this with her PCP and monitoring for now.  She also states she has had many stressors this year.      Review of systems: Review of Systems  Constitutional: Negative for chills, fever and malaise/fatigue.  HENT: Negative for congestion, ear discharge, nosebleeds and sore throat.   Eyes: Negative for pain, discharge and redness.  Respiratory: Negative for cough, shortness of breath and wheezing.   Cardiovascular: Negative for chest pain.  Gastrointestinal: Negative for abdominal pain, constipation, diarrhea, heartburn, nausea and vomiting.  Musculoskeletal: Negative for joint pain.  Skin: Negative for itching and rash.  Neurological: Negative for headaches.    All other systems negative unless noted above in HPI  Past  medical/social/surgical/family history have been reviewed and are unchanged unless specifically indicated below.  No changes  Medication List: Allergies as of 07/02/2019      Reactions   Ketoprofen Rash      Medication List       Accurate as of July 02, 2019  9:05 AM. If you have any questions, ask your nurse or doctor.        albuterol 108 (90 Base) MCG/ACT inhaler Commonly known as: Ventolin HFA Inhale 2 puffs into the lungs every 4 (four) hours as needed (for cough or wheeze).   Azelastine-Fluticasone 137-50 MCG/ACT Susp Commonly known as: Dymista Place 1 spray into both nostrils 2 (two) times daily.   B COMPLEX PO Take by mouth daily.   cetirizine 10 MG tablet Commonly known as: ZYRTEC Take 10 mg by mouth daily.   clonazePAM 0.5 MG disintegrating tablet Commonly known as: KLONOPIN TAKE 1 TABLET (0.5 MG TOTAL) BY MOUTH NIGHTLY AS NEEDED FOR UP TO 30 DAYS   cycloSPORINE 0.05 % ophthalmic emulsion Commonly known as: RESTASIS Place 1 drop into both eyes 2 times daily.   diphenhydrAMINE 25 mg capsule Commonly known as: BENADRYL Take by mouth.   diphenhydramine-acetaminophen 25-500 MG Tabs tablet Commonly known as: TYLENOL PM Take 1 tablet by mouth at bedtime as needed.   ibuprofen 800 MG tablet Commonly known as: ADVIL Take 1 tablet (800 mg total) by mouth every 8 (eight) hours as needed.   montelukast 10 MG tablet Commonly known as: SINGULAIR TAKE  1 TABLET EACH EVENING TO PREVENT COUGH OR WHEEZE.   montelukast 10 MG tablet Commonly known as: SINGULAIR TAKE ONE TABLET EACH EVENING TO PREVENT COUGH OR WHEEZE.   norethindrone-ethinyl estradiol 1-20 MG-MCG tablet Commonly known as: Junel FE 1/20 Take 1 tablet by mouth daily.   Olopatadine HCl 0.2 % Soln Apply 1 drop to eye daily as needed.   XYZAL ALLERGY 24HR PO       Known medication allergies: Allergies  Allergen Reactions  . Ketoprofen Rash     Physical examination: Blood pressure  (!) 152/112, pulse 93, temperature 98.3 F (36.8 C), temperature source Temporal, resp. rate 18, height 5\' 4"  (1.626 m), weight 234 lb 3.2 oz (106.2 kg), SpO2 98 %.  General: Alert, interactive, in no acute distress. HEENT: PERRLA, TMs pearly gray, turbinates minimally edematous with clear discharge, post-pharynx non erythematous. Neck: Supple without lymphadenopathy. Lungs: Clear to auscultation without wheezing, rhonchi or rales. {no increased work of breathing. CV: Normal S1, S2 without murmurs. Abdomen: Nondistended, nontender. Skin: Warm and dry, without lesions or rashes. Extremities:  No clubbing, cyanosis or edema. Neuro:   Grossly intact.  Diagnositics/Labs: Spirometry: FEV1: 3.04L 98%, FVC: 3.65L 97%, ratio consistent with nonobstructive pattern  Assessment and plan:   Allergic rhinitis with conjunctivitis  - Continue Singulair 10 mg daily  - Continue dymista sample use 1 spray twice a day as needed to help with nasal drainage/post-nasal drip  - Continue nasal saline rinse/spray   - Continue Xyzal 5mg  daily as needed.   - For itchy/watery/red eyes use Olopatadine 0.2% 1 drop each eye daily as needed.   Exercise-induced asthma  - lung function looks great today!   - have access to albuterol inhaler 2 puffs every 4-6 hours as needed for cough/wheeze/shortness of breath/chest tightness.  May use 15-20 minutes prior to activity.   Monitor frequency of use.    Let know if you are not meeting the below goals Control goals:   Full participation in all desired activities (may need albuterol before activity)  Albuterol use two time or less a week on average (not counting use with activity)  Cough interfering with sleep two time or less a month  Oral steroids no more than once a year  No hospitalizations  Hymenoptera hypersensitivity  - Continue avoidance of stinging insects and follow emergency action plan in case of reaction. Have access to Epipen or AuviQ for as  needed use in case of severe reaction if stung  Follow-up in 12 months or sooner if needed.  I appreciate the opportunity to take part in [place] care. Please do not hesitate to contact me with questions.  Sincerely,   [place], MD Allergy/Immunology [facility]

## 2019-07-02 NOTE — Patient Instructions (Addendum)
Allergic rhinitis with conjunctivitis  - Continue Singulair 10 mg daily  - Continue dymista sample use 1 spray twice a day as needed to help with nasal drainage/post-nasal drip  - Continue nasal saline rinse/spray   - Continue Xyzal 5mg  daily as needed.   - For itchy/watery/red eyes use Olopatadine 0.2% 1 drop each eye daily as needed.   Exercise-induced asthma  - lung function looks great today!   - have access to albuterol inhaler 2 puffs every 4-6 hours as needed for cough/wheeze/shortness of breath/chest tightness.  May use 15-20 minutes prior to activity.   Monitor frequency of use.    Let us know if you are not meeting the below goals Control goals:   Full participation in all desired activities (may need albuterol before activity)  Albuterol use two time or less a week on average (not counting use with activity)  Cough interfering with sleep two time or less a month  Oral steroids no more than once a year  No hospitalizations  Hymenoptera hypersensitivity  - Continue avoidance of stinging insects and follow emergency action plan in case of reaction. Have access to Epipen or Vivian for as needed use in case of severe reaction if stung  Follow-up in 12 months or sooner if needed.

## 2019-07-06 ENCOUNTER — Encounter: Payer: Self-pay | Admitting: Certified Nurse Midwife

## 2019-07-06 ENCOUNTER — Other Ambulatory Visit: Payer: Self-pay

## 2019-07-06 ENCOUNTER — Other Ambulatory Visit (HOSPITAL_COMMUNITY)
Admission: RE | Admit: 2019-07-06 | Discharge: 2019-07-06 | Disposition: A | Discharge status: Home / Self Care | Payer: PRIVATE HEALTH INSURANCE | Source: Ambulatory Visit | Attending: Certified Nurse Midwife | Admitting: Certified Nurse Midwife

## 2019-07-06 ENCOUNTER — Ambulatory Visit (INDEPENDENT_AMBULATORY_CARE_PROVIDER_SITE_OTHER): Payer: PRIVATE HEALTH INSURANCE | Admitting: Certified Nurse Midwife

## 2019-07-06 VITALS — BP 118/78 | HR 68 | Temp 97.0°F | Resp 16 | Ht 64.25 in | Wt 230.0 lb

## 2019-07-06 DIAGNOSIS — Z124 Encounter for screening for malignant neoplasm of cervix: Secondary | ICD-10-CM

## 2019-07-06 DIAGNOSIS — Z3041 Encounter for surveillance of contraceptive pills: Secondary | ICD-10-CM | POA: Diagnosis not present

## 2019-07-06 DIAGNOSIS — Z01419 Encounter for gynecological examination (general) (routine) without abnormal findings: Secondary | ICD-10-CM

## 2019-07-06 MED ORDER — NORETHIN ACE-ETH ESTRAD-FE 1-20 MG-MCG PO TABS: 1.0000 | ORAL_TABLET | Freq: Every day | ORAL | 4 refills | Status: DC

## 2019-07-06 NOTE — Patient Instructions (Signed)

## 2019-07-06 NOTE — Progress Notes (Signed)
38 y.o. G0P0000 Single  Caucasian Fe here for annual exam. Periods normal issues. OCP working well, denies warning signs with use. Not sexually active, no STD concerns or testing today. Cousin had Covid 19 recovered well. Suspect father had early 10/2018, but tested negative. Currently seeing PCP [facility] and now on Klonopin working well for anxiety. All other medications stable with PCP management. Working in clinic daily, no concerns. No health issues today. Took another trip to [place]!  Patient's last menstrual period was 04/05/2019.          Sexually active: No.  The current method of family planning is OCP (estrogen/progesterone).    Exercising: No.  exercise Smoker:  no  Review of Systems  Constitutional: Negative.   HENT: Negative.   Eyes: Negative.   Respiratory: Negative.   Cardiovascular: Negative.   Gastrointestinal: Negative.   Genitourinary: Negative.   Musculoskeletal: Negative.   Skin: Negative.   Neurological: Negative.   Endo/Heme/Allergies: Negative.   Psychiatric/Behavioral: Negative.     Health Maintenance: Pap:  06-21-16 neg HPV HR neg History of Abnormal Pap: no MMG:  none  Self Breast exams: occ Colonoscopy:  none BMD:   none TDaP:  2017 Shingles: no Pneumonia: had done Hep C and HIV: HIV neg in the past after false positive when trying to donate blood Labs: with PCP   reports that she has never smoked. She has never used smokeless tobacco. She reports current alcohol use of about 3.0 standard drinks of alcohol per week. She reports that she does not use drugs.  Past Medical History:  Diagnosis Date  . Asthma 02/2007   allergys  . GERD (gastroesophageal reflux disease)   . Hypermobility of joint   . Menstrual migraine     Past Surgical History:  Procedure Laterality Date  . birth mark removal  25  . MOLE REMOVAL      Current Outpatient Medications  Medication Sig Dispense Refill  . albuterol (VENTOLIN HFA) 108 (90 Base) MCG/ACT inhaler  Inhale 2 puffs into the lungs every 4 (four) hours as needed (for cough or wheeze). 18 g 1  . Azelastine-Fluticasone (DYMISTA) 137-50 MCG/ACT SUSP Place 1 spray into both nostrils 2 (two) times daily. 23 g 5  . B Complex Vitamins (B COMPLEX PO) Take by mouth daily.    . cetirizine (ZYRTEC) 10 MG tablet Take 10 mg by mouth daily.      . clonazePAM (KLONOPIN) 0.5 MG disintegrating tablet TAKE 1 TABLET (0.5 MG TOTAL) BY MOUTH NIGHTLY AS NEEDED FOR UP TO 30 DAYS    . cycloSPORINE (RESTASIS) 0.05 % ophthalmic emulsion Place 1 drop into both eyes 2 times daily.    . diphenhydrAMINE (BENADRYL) 25 mg capsule Take by mouth.    . diphenhydramine-acetaminophen (TYLENOL PM) 25-500 MG TABS tablet Take 1 tablet by mouth at bedtime as needed.    Marland Kitchen EPINEPHrine 0.3 mg/0.3 mL IJ SOAJ injection Use for life threatening allergic reactions 4 each 3  . ibuprofen (ADVIL,MOTRIN) 800 MG tablet Take 1 tablet (800 mg total) by mouth every 8 (eight) hours as needed. 30 tablet 1  . Levocetirizine Dihydrochloride (XYZAL ALLERGY 24HR PO)     . montelukast (SINGULAIR) 10 MG tablet TAKE ONE TABLET EACH EVENING TO PREVENT COUGH OR WHEEZE. 90 tablet 5  . montelukast (SINGULAIR) 10 MG tablet TAKE 1 TABLET EACH EVENING TO PREVENT COUGH OR WHEEZE. 30 tablet 5  . norethindrone-ethinyl estradiol (JUNEL FE 1/20) 1-20 MG-MCG tablet Take 1 tablet by mouth daily.  84 tablet 4  . Olopatadine HCl 0.2 % SOLN Apply 1 drop to eye daily as needed. 2.5 mL 5   No current facility-administered medications for this visit.     Family History  Problem Relation Age of Onset  . Cancer Mother        lung  . Hyperlipidemia Mother   . Other Mother        soft tissue sarcoma of rt thigh  . Hypertension Father   . Glaucoma Father   . Cancer Maternal Grandmother        lymphatic  . Glaucoma Paternal Grandmother   . Hypertension Paternal Grandmother   . Diabetes Paternal Grandfather   . Hypertension Paternal Grandfather   . Heart disease Paternal  Grandfather        heart attack  . Cancer Paternal Grandfather        melanoma  . Dementia Paternal Grandfather   . Heart disease Maternal Aunt        heart attack under age 47  . Lung cancer Maternal Aunt   . Brain cancer Maternal Aunt     ROS:  Pertinent items are noted in HPI.  Otherwise, a comprehensive ROS was negative.  Exam:   BP 118/78   Pulse 68   Temp (!) 97 F (36.1 C) (Skin)   Resp 16   Ht 5' 4.25" (1.632 m)   Wt 230 lb (104.3 kg)   LMP 04/05/2019   BMI 39.17 kg/m  Height: 5' 4.25" (163.2 cm) Ht Readings from Last 3 Encounters:  07/06/19 5' 4.25" (1.632 m)  07/02/19 5\' 4"  (1.626 m)  07/01/18 5\' 3"  (1.6 m)    General appearance: alert, cooperative and appears stated age Head: Normocephalic, without obvious abnormality, atraumatic Neck: no adenopathy, supple, symmetrical, trachea midline and thyroid normal to inspection and palpation Lungs: clear to auscultation bilaterally Breasts: normal appearance, no masses or tenderness, No nipple retraction or dimpling, No nipple discharge or bleeding, No axillary or supraclavicular adenopathy Heart: regular rate and rhythm Abdomen: soft, non-tender; no masses,  no organomegaly Extremities: extremities normal, atraumatic, no cyanosis or edema Skin: Skin color, texture, turgor normal. No rashes or lesions Lymph nodes: Cervical, supraclavicular, and axillary nodes normal. No abnormal inguinal nodes palpated Neurologic: Grossly normal   Pelvic: External genitalia:  no lesions              Urethra:  normal appearing urethra with no masses, tenderness or lesions              Bartholin's and Skene's: normal                 Vagina: normal appearing vagina with normal color and discharge, no lesions              Cervix: no cervical motion tenderness, no lesions and nulliparous appearance, bleeding with pap              Pap taken: Yes.   Bimanual Exam:  Uterus:  normal size, contour, position, consistency, mobility, non-tender  and anteverted              Adnexa: normal adnexa and no mass, fullness, tenderness               Rectovaginal: Confirms               Anus:  normal sphincter tone, no lesions  Chaperone present: yes  A:  Well Woman with normal exam  Contraception OCP working well for  cycle control  Klonopin working well for anxiety issues.  Weight management in progress with exercise    P:   Reviewed health and wellness pertinent to exam  Risks/benefits/warning signs with OCP reviewed  Rx Junel Fe 1/20 see order with instructions  Continue follow up with PCP regarding other health issues and medication management as indicated  Stressed importance of weight control and good diet.  Pap smear: yes   counseled on breast self exam, STD prevention, HIV risk factors and prevention, feminine hygiene, use and side effects of OCP's, adequate intake of calcium and vitamin D, diet and exercise  return annually or prn  An After Visit Summary was printed and given to the patient.

## 2019-07-08 LAB — CYTOLOGY - PAP: Diagnosis: NEGATIVE

## 2019-12-24 ENCOUNTER — Encounter: Payer: Self-pay | Admitting: Certified Nurse Midwife

## 2019-12-26 ENCOUNTER — Other Ambulatory Visit: Payer: Self-pay | Admitting: Allergy

## 2019-12-26 DIAGNOSIS — H1010 Acute atopic conjunctivitis, unspecified eye: Secondary | ICD-10-CM

## 2020-02-05 HISTORY — PX: GALLBLADDER SURGERY: SHX652

## 2020-03-14 DIAGNOSIS — E782 Mixed hyperlipidemia: Secondary | ICD-10-CM | POA: Insufficient documentation

## 2020-06-22 DIAGNOSIS — F419 Anxiety disorder, unspecified: Secondary | ICD-10-CM | POA: Insufficient documentation

## 2020-06-22 DIAGNOSIS — G43829 Menstrual migraine, not intractable, without status migrainosus: Secondary | ICD-10-CM | POA: Insufficient documentation

## 2020-07-05 ENCOUNTER — Other Ambulatory Visit: Payer: Self-pay | Admitting: *Deleted

## 2020-07-05 DIAGNOSIS — Z3041 Encounter for surveillance of contraceptive pills: Secondary | ICD-10-CM

## 2020-07-05 NOTE — Telephone Encounter (Signed)
Medication refill request: Junel  Last AEX:  07-06-2019 DL  Next AEX: scheduled with new provider in 11-21 Last MMG (if hormonal medication request): n/a Refill authorized: Today, please advise.   Call to patient. Patient states she has scheduled with a new provider/practice, but can't be seen until November. Asking if her OCP can be refilled until that appointment. RN advised would sent request to covering provider. Patient agreeable. Pharmacy confirmed as [facility].   Medication pended for #84, 0RF. Please refill if appropriate.

## 2020-07-06 ENCOUNTER — Ambulatory Visit (INDEPENDENT_AMBULATORY_CARE_PROVIDER_SITE_OTHER): Payer: PRIVATE HEALTH INSURANCE | Admitting: Allergy

## 2020-07-06 ENCOUNTER — Encounter: Payer: Self-pay | Admitting: Allergy

## 2020-07-06 ENCOUNTER — Other Ambulatory Visit: Payer: Self-pay

## 2020-07-06 VITALS — BP 128/84 | HR 98 | Temp 98.4°F | Resp 16 | Ht 65.0 in | Wt 230.0 lb

## 2020-07-06 DIAGNOSIS — J3089 Other allergic rhinitis: Secondary | ICD-10-CM

## 2020-07-06 DIAGNOSIS — Z91030 Bee allergy status: Secondary | ICD-10-CM | POA: Diagnosis not present

## 2020-07-06 DIAGNOSIS — H1013 Acute atopic conjunctivitis, bilateral: Secondary | ICD-10-CM | POA: Diagnosis not present

## 2020-07-06 DIAGNOSIS — Z91038 Other insect allergy status: Secondary | ICD-10-CM

## 2020-07-06 DIAGNOSIS — J45990 Exercise induced bronchospasm: Secondary | ICD-10-CM

## 2020-07-06 MED ORDER — MONTELUKAST SODIUM 10 MG PO TABS: ORAL_TABLET | ORAL | 11 refills | Status: DC

## 2020-07-06 MED ORDER — EPINEPHRINE 0.3 MG/0.3ML IJ SOAJ: INTRAMUSCULAR | 3 refills | Status: DC

## 2020-07-06 MED ORDER — NORETHIN ACE-ETH ESTRAD-FE 1-20 MG-MCG PO TABS: 1.0000 | ORAL_TABLET | Freq: Every day | ORAL | 0 refills | Status: DC

## 2020-07-06 MED ORDER — AZELASTINE-FLUTICASONE 137-50 MCG/ACT NA SUSP: 1.0000 | Freq: Two times a day (BID) | NASAL | 11 refills | Status: DC

## 2020-07-06 NOTE — Patient Instructions (Addendum)
Allergic rhinitis with conjunctivitis  - Continue Singulair 10 mg daily  - Continue dymista use 1 spray twice a day at this time to help with morning congestion.  Let us know if this is not enough to control congestion  - Continue nasal saline rinse/spray   - Continue Xyzal 5mg  daily as needed.   - For itchy/watery/red eyes use Olopatadine 0.2% 1 drop each eye daily as needed.   - recommend use of dust mite encasing for pillow and mattress  Exercise-induced asthma  - under good control  - have access to albuterol inhaler 2 puffs every 4-6 hours as needed for cough/wheeze/shortness of breath/chest tightness.  May use 15-20 minutes prior to activity.   Monitor frequency of use.    Let know if you are not meeting the below goals Control goals:   Full participation in all desired activities (may need albuterol before activity)  Albuterol use two time or less a week on average (not counting use with activity)  Cough interfering with sleep two time or less a month  Oral steroids no more than once a year  No hospitalizations  Hymenoptera hypersensitivity  - Continue avoidance of stinging insects and follow emergency action plan in case of reaction. Have access to Epipen or AuviQ for as needed use in case of severe reaction if stung  Follow-up in 12 months or sooner if needed.

## 2020-07-06 NOTE — Progress Notes (Signed)
Follow-up Note  RE: Toni Monroe MRN: [id number] DOB: 01/09/1981 Date of Office Visit: 07/06/2020   History of present illness: Toni Monroe is a 39 y.o. female presenting today for follow-up of allergic rhinitis with conjunctivitis, asthma and stinging insect allergy.  She was last seen in the office on 07/02/2019 by myself.   She had gallbladder removal May 2021 and recovered well from this.   She states this is the time of year when her allergy symptoms worsen. She has been having more nasal congestion worse in AM.  Also having allergy triggered migraines however also has menstral induced migraines and has an abortive plan in place for that that she will use for her allergen driven migraines. She has dymista but only uses it at this time for nasal drainage.  Will reserve Xyzal use for more severe allergy symptoms.  Takes singulair daily.   With her asthma has not had any issues and no albuterol use in past year.  No ED/UC visits or systemic steroid needs.   She takes prilosec bwice a day for her reflux control.  Has not had any stings or need to use epinephrine device.   Review of systems: Review of Systems  Constitutional: Negative.   HENT: Positive for congestion.   Eyes: Negative.   Respiratory: Negative.   Cardiovascular: Negative.   Gastrointestinal: Negative.   Musculoskeletal: Negative.   Skin: Negative.   Neurological: Negative.     All other systems negative unless noted above in HPI  Past medical/social/surgical/family history have been reviewed and are unchanged unless specifically indicated below.  No changes  Medication List: Current Outpatient Medications  Medication Sig Dispense Refill  . albuterol (VENTOLIN HFA) 108 (90 Base) MCG/ACT inhaler Inhale 2 puffs into the lungs every 4 (four) hours as needed (for cough or wheeze). 18 g 1  . Azelastine-Fluticasone (DYMISTA) 137-50 MCG/ACT SUSP Place 1 spray into both nostrils 2 (two) times daily. 23  g 5  . B Complex Vitamins (B COMPLEX PO) Take by mouth daily.    . cetirizine (ZYRTEC) 10 MG tablet Take 10 mg by mouth daily.      . clonazePAM (KLONOPIN) 0.5 MG disintegrating tablet TAKE 1 TABLET (0.5 MG TOTAL) BY MOUTH NIGHTLY AS NEEDED FOR UP TO 30 DAYS    . cycloSPORINE (RESTASIS) 0.05 % ophthalmic emulsion Place 1 drop into both eyes 2 times daily.    . diphenhydrAMINE (BENADRYL) 25 mg capsule Take by mouth.    . diphenhydramine-acetaminophen (TYLENOL PM) 25-500 MG TABS tablet Take 1 tablet by mouth at bedtime as needed.    Marland Kitchen EPINEPHrine 0.3 mg/0.3 mL IJ SOAJ injection Use for life threatening allergic reactions 4 each 3  . ibuprofen (ADVIL,MOTRIN) 800 MG tablet Take 1 tablet (800 mg total) by mouth every 8 (eight) hours as needed. 30 tablet 1  . Levocetirizine Dihydrochloride (XYZAL ALLERGY 24HR PO)     . montelukast (SINGULAIR) 10 MG tablet TAKE 1 TABLET EACH EVENING TO PREVENT COUGH OR WHEEZE. 90 tablet 5  . norethindrone-ethinyl estradiol (JUNEL FE 1/20) 1-20 MG-MCG tablet Take 1 tablet by mouth daily. 84 tablet 4  . Olopatadine HCl 0.2 % SOLN Apply 1 drop to eye daily as needed. 2.5 mL 5  . omeprazole (PRILOSEC) 20 MG capsule Take 20 mg by mouth 2 (two) times daily.    . traZODone (DESYREL) 50 MG tablet Take 50 mg by mouth at bedtime. (Patient not taking: Reported on 07/06/2020)     No current  facility-administered medications for this visit.     Known medication allergies: Allergies  Allergen Reactions  . Ketoprofen Rash     Physical examination: Blood pressure 128/84, pulse 98, temperature 98.4 F (36.9 C), resp. rate 16, height 5\' 5"  (1.651 m), weight 230 lb (104.3 kg), SpO2 98 %.  General: Alert, interactive, in no acute distress. HEENT: PERRLA, TMs pearly gray, turbinates minimally edematous without discharge, post-pharynx non erythematous. Neck: Supple without lymphadenopathy. Lungs: Clear to auscultation without wheezing, rhonchi or rales. {no increased work of  breathing. CV: Normal S1, S2 without murmurs. Abdomen: Nondistended, nontender. Skin: Warm and dry, without lesions or rashes. Extremities:  No clubbing, cyanosis or edema. Neuro:   Grossly intact.  Diagnositics/Labs: ACT score - 24 -indicates good control  Assessment and plan:   Allergic rhinitis with conjunctivitis  - Continue Singulair 10 mg daily  - Continue dymista use 1 spray twice a day at this time to help with morning congestion.  Let know if this is not enough to control congestion  - Continue nasal saline rinse/spray   - Continue Xyzal 5mg  daily as needed.   - For itchy/watery/red eyes use Olopatadine 0.2% 1 drop each eye daily as needed.   - recommend use of dust mite encasing for pillow and mattress  Exercise-induced asthma  - under good control  - have access to albuterol inhaler 2 puffs every 4-6 hours as needed for cough/wheeze/shortness of breath/chest tightness.  May use 15-20 minutes prior to activity.   Monitor frequency of use.    Let us know if you are not meeting the below goals Control goals:   Full participation in all desired activities (may need albuterol before activity)  Albuterol use two time or less a week on average (not counting use with activity)  Cough interfering with sleep two time or less a month  Oral steroids no more than once a year  No hospitalizations  Hymenoptera hypersensitivity  - Continue avoidance of stinging insects and follow emergency action plan in case of reaction. Have access to Epipen or AuviQ for as needed use in case of severe reaction if stung  Follow-up in 12 months or sooner if needed. I appreciate the opportunity to take part in [place] care. Please do not hesitate to contact me with questions.  Sincerely,   [place], MD Allergy/Immunology [facility]

## 2020-07-08 ENCOUNTER — Other Ambulatory Visit: Payer: Self-pay | Admitting: Obstetrics and Gynecology

## 2020-07-08 DIAGNOSIS — Z3041 Encounter for surveillance of contraceptive pills: Secondary | ICD-10-CM

## 2020-07-10 ENCOUNTER — Ambulatory Visit: Payer: PRIVATE HEALTH INSURANCE | Admitting: Certified Nurse Midwife

## 2021-01-12 NOTE — Progress Notes (Signed)
Office Visit Note  Patient: Toni Monroe             Date of Birth: Dec 11, 1980           MRN: [id number]             PCP: Rafael Bihari, MD Referring: Rafael Bihari, * Visit Date: 01/25/2021 Occupation: @GUAROCC @  Subjective:  Pain in multiple joints and muscles.   History of Present Illness: Toni Monroe is a 40 y.o. female seen in consultation per request of her PCP.  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  She realized that she had hypermobility when she was doing yoga classes.  She was evaluated by Dr. 24 who diagnosed her with hypermobility.  She states she has had off and on right middle trigger finger which improved over time.  She has been to integrative therapies in the past and had dry needling for the right wrist joint pain.  She had been doing isometric exercises at [facility] until the pandemic hit.  She is allergic to the chlorine and cannot swim.  She states in her early 30s she started running half marathon and had multiple joints and muscle issues.  She went to physical therapy several times.  She has been only walking for exercise now.  She states in 2012 she twisted her right ankle and had a sprain.  She was in a boot for a while but, she used a scooter and then she was in a cast.  She still have occasional discomfort in her right ankle joint.  She has frequent discomfort in her wrists, hips, knees and ankles.  She has not noticed any joint swelling.  She states that her mother has some hypermobility but never been diagnosed with it.  She does not have any symptoms.  She is gravida 0, para 0.  There is no family history of any autoimmune disease.  She denies any history of oral ulcers, nasal ulcers, malar rash, sicca symptoms, malar rash or lymphadenopathy.  She gets frequent rash from allergies.  Activities of Daily Living:  Patient reports morning stiffness for 1-2 hours.   Patient Denies nocturnal pain.   Difficulty dressing/grooming: Denies Difficulty climbing stairs: Denies Difficulty getting out of chair: Denies Difficulty using hands for taps, buttons, cutlery, and/or writing: Denies  Review of Systems  Constitutional: Negative for fatigue, night sweats, weight gain and weight loss.  HENT: Positive for mouth dryness and nose dryness. Negative for mouth sores, trouble swallowing and trouble swallowing.   Eyes: Positive for dryness. Negative for pain, redness, itching and visual disturbance.  Respiratory: Negative for cough, shortness of breath and difficulty breathing.   Cardiovascular: Negative for chest pain, palpitations, hypertension, irregular heartbeat and swelling in legs/feet.  Gastrointestinal: Negative for blood in stool, constipation and diarrhea.  Endocrine: Negative for increased urination.  Genitourinary: Negative for difficulty urinating and vaginal dryness.  Musculoskeletal: Positive for arthralgias, joint pain, myalgias, morning stiffness, muscle tenderness and myalgias. Negative for joint swelling and muscle weakness.  Skin: Positive for rash and sensitivity to sunlight. Negative for color change, hair loss, redness, skin tightness and ulcers.  Allergic/Immunologic: Negative for susceptible to infections.  Neurological: Negative for dizziness, numbness, headaches, memory loss, night sweats and weakness.  Hematological: Positive for bruising/bleeding tendency. Negative for swollen glands.  Psychiatric/Behavioral: Negative for depressed mood, confusion and sleep disturbance. The patient is nervous/anxious.     PMFS History:  Patient Active Problem List  Diagnosis Date Noted  . Elevated BP without diagnosis of hypertension 10/27/2018  . Exercise-induced asthma 10/09/2018  . Left hip pain 06/10/2017  . Acute sinusitis 12/11/2015  . Asthma with acute exacerbation 12/11/2015  . Right groin pain 10/25/2015  . History of systemic reaction to hymenoptera 06/14/2015  .  Mild intermittent asthma 06/14/2015  . Allergic rhinoconjunctivitis 06/14/2015  . Obesity (BMI 30-39.9) 07/13/2014  . Environmental and seasonal allergies 07/13/2014  . Backache 11/05/2013  . Leg length discrepancy 11/05/2013  . Right ankle instability 10/09/2012  . Wrist pain, right 06/27/2011  . Right trigger finger 06/27/2011  . 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 Asthma 02/2007   allergys  . GERD (gastroesophageal reflux disease)   . Hypermobility of joint   . Menstrual migraine     Family History  Problem Relation Age of Onset  . Cancer Mother        lung  . Hyperlipidemia Mother   . Other Mother        soft tissue sarcoma of rt thigh  . Hearing loss Mother   . Arthritis Mother   . Bursitis Mother   . Hypertension Father   . Glaucoma Father   . Cancer Maternal Grandmother        lymphatic  . Glaucoma Paternal Grandmother   . Hypertension Paternal Grandmother   . Diabetes Paternal Grandfather   . Hypertension Paternal Grandfather   . Heart disease Paternal Grandfather        heart attack  . Cancer Paternal Grandfather        melanoma  . Dementia Paternal Grandfather   . Heart disease Maternal Aunt        heart attack under age 79  . Lung cancer Maternal Aunt   . Brain cancer Maternal Aunt    Past Surgical History:  Procedure Laterality Date  . birth mark removal  70  . GALLBLADDER SURGERY  02/2020  . MOLE REMOVAL     Social History   Social History Narrative  . Not on file   Immunization History  Administered Date(s) Administered  . DTaP 01/20/2007  . Influenza,inj,Quad PF,6+ Mos 07/04/2018  . Influenza-Unspecified 06/18/2016  . PFIZER(Purple Top)SARS-COV-2 Vaccination 10/06/2019, 10/22/2019, 07/21/2020  . Tdap 06/21/2016     Objective: Vital Signs: BP (!) 145/103 (BP Location: Right Wrist, Patient Position: Sitting, Cuff Size: Normal)   Pulse (!) 102   Resp 15   Ht 5' 4.25" (1.632 m)   Wt 230 lb  (104.3 kg)   BMI 39.17 kg/m    Physical Exam Vitals and nursing note reviewed.  Constitutional:      Appearance: She is well-developed.  HENT:     Head: Normocephalic and atraumatic.  Eyes:     Conjunctiva/sclera: Conjunctivae normal.  Cardiovascular:     Rate and Rhythm: Normal rate and regular rhythm.     Heart sounds: Normal heart sounds.  Pulmonary:     Effort: Pulmonary effort is normal.     Breath sounds: Normal breath sounds.  Abdominal:     General: Bowel sounds are normal.     Palpations: Abdomen is soft.  Musculoskeletal:     Cervical back: Normal range of motion.  Lymphadenopathy:     Cervical: No cervical adenopathy.  Skin:    General: Skin is warm and dry.     Capillary Refill: Capillary refill takes less than 2 seconds.  Neurological:     Mental Status: She is alert  and oriented to person, place, and time.  Psychiatric:        Behavior: Behavior normal.      Musculoskeletal Exam: C-spine thoracic and lumbar spine with good range of motion.  Shoulder joints, elbow joints, wrist joints, MCPs PIPs and DIPs with good range of motion.  She has hypermobility in her elbows, MCPs and PIPs and DIPs.  Hip joints and knee joints with good range of motion.  She has mild hyperextension of her knee joints.  She was unable to reach the floor with the palm of her hands.  She had no evidence of Achilles tendinitis or plantar fasciitis.  She has multiple tender points including bilateral trapezius costochondral, lateral epicondyle, medial aspect of her knee joints and trochanteric bursa.  She also had some hyperalgesia.  CDAI Exam: CDAI Score: -- Patient Global: --; Provider Global: -- Swollen: --; Tender: -- Joint Exam 01/25/2021   No joint exam has been documented for this visit   There is currently no information documented on the homunculus. Go to the Rheumatology activity and complete the homunculus joint exam.  Investigation: No additional findings.  Imaging: No  results found.  Recent Labs: Lab Results  Component Value Date   WBC 7.5 06/21/2016   HGB 14.1 06/21/2016   PLT 278 06/21/2016    Speciality Comments: No specialty comments available.  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  She states her mother has some hypermobility as well.  She has been seen by Dr. Darrick PennaFields in the past and had been evaluated.  She has no skin laxity.  Plan: Ambulatory referral to Physical Therapy  Polyarthralgia -she complains of pain and discomfort in the multiple joints.  The discomfort gets worse with exercise.  We discussed water aerobics and swimming but she is allergic to chlorine.  She has been walking on a regular basis which should be good.  I do not see any synovitis on examination.  She denies any history of joint swelling.  I will obtain following labs to complete the work-up.  Plan: Sedimentation rate, Rheumatoid factor, Cyclic citrul peptide antibody, IgG.  We will notify her of the lab results.  I advised her to contact me in case she has increased joint pain or swelling.  Otherwise she will return only on.  Basis.  Myalgia -she complains of increased muscle pain with activities.  I will get baseline CK.  I believe she has a component of myofascial pain syndrome.  Detailed counseling guarding myofascial pain syndrome was provided.  She has positive tender points and hyperalgesia.  She would benefit from regular exercise and good sleep hygiene.  Plan: CK, Ambulatory referral to Physical Therapy  Essential hypertension-blood pressure is elevated today.  She states she ran out of metoprolol and has not filled it yet.  History of hyperlipidemia-her recent labs showed elevated cholesterol and LDL.  Weight loss diet and exercise was emphasized.  [facility] which  was not [facility].  She will try at her own.  Weight loss may help with the generalized aching as well.  Other medical problems are listed as follows:  History of gastroesophageal reflux (GERD)  Hx of migraines  History of anxiety  History of asthma  Environmental and seasonal allergies  History of systemic reaction to hymenoptera  Orders: Orders Placed This  Encounter  Procedures  . CK  . Sedimentation rate  . Rheumatoid factor  . Cyclic citrul peptide antibody, IgG  . Ambulatory referral to Physical Therapy   No orders of the defined types were placed in this encounter.   Follow-Up Instructions: Return if symptoms worsen or fail to improve, for Hypermobility arthralgia, myofascial pain.   Pollyann Savoy, MD  Note - This record has been created using Animal nutritionist.  Chart creation errors have been sought, but may not always  have been located. Such creation errors do not reflect on  the standard of medical care.

## 2021-01-25 ENCOUNTER — Encounter: Payer: Self-pay | Admitting: Rheumatology

## 2021-01-25 ENCOUNTER — Other Ambulatory Visit: Payer: Self-pay

## 2021-01-25 ENCOUNTER — Telehealth: Payer: Self-pay

## 2021-01-25 ENCOUNTER — Ambulatory Visit: Payer: PRIVATE HEALTH INSURANCE | Admitting: Rheumatology

## 2021-01-25 VITALS — BP 145/103 | HR 102 | Resp 15 | Ht 64.25 in | Wt 230.0 lb

## 2021-01-25 DIAGNOSIS — Z8639 Personal history of other endocrine, nutritional and metabolic disease: Secondary | ICD-10-CM

## 2021-01-25 DIAGNOSIS — M249 Joint derangement, unspecified: Secondary | ICD-10-CM

## 2021-01-25 DIAGNOSIS — M2550 Pain in unspecified joint: Secondary | ICD-10-CM | POA: Diagnosis not present

## 2021-01-25 DIAGNOSIS — Z8669 Personal history of other diseases of the nervous system and sense organs: Secondary | ICD-10-CM

## 2021-01-25 DIAGNOSIS — I10 Essential (primary) hypertension: Secondary | ICD-10-CM

## 2021-01-25 DIAGNOSIS — M791 Myalgia, unspecified site: Secondary | ICD-10-CM | POA: Diagnosis not present

## 2021-01-25 DIAGNOSIS — Z8709 Personal history of other diseases of the respiratory system: Secondary | ICD-10-CM

## 2021-01-25 DIAGNOSIS — Z8719 Personal history of other diseases of the digestive system: Secondary | ICD-10-CM

## 2021-01-25 DIAGNOSIS — Z8659 Personal history of other mental and behavioral disorders: Secondary | ICD-10-CM

## 2021-01-25 DIAGNOSIS — Z91038 Other insect allergy status: Secondary | ICD-10-CM

## 2021-01-25 DIAGNOSIS — J3089 Other allergic rhinitis: Secondary | ICD-10-CM

## 2021-01-25 NOTE — Telephone Encounter (Signed)
New patient appointment on 01/25/2021. Per Dr. Corliss Skains, we can call patient with lab results. No follow up is needed at this time.

## 2021-01-26 LAB — RHEUMATOID FACTOR: Rheumatoid fact SerPl-aCnc: <14 IU/mL (ref <14)

## 2021-01-26 LAB — CK: Total CK: 93 U/L (ref 29–143)

## 2021-01-26 LAB — CYCLIC CITRUL PEPTIDE ANTIBODY, IGG: Cyclic Citrullin Peptide Ab: <16 UNITS

## 2021-01-26 LAB — SEDIMENTATION RATE: Sed Rate: 17 mm/h (ref 0–20)

## 2021-01-26 NOTE — Progress Notes (Signed)
All the labs including anti-CCP are negative.

## 2021-01-28 ENCOUNTER — Encounter: Payer: Self-pay | Admitting: Rheumatology

## 2021-02-04 DIAGNOSIS — U071 COVID-19: Secondary | ICD-10-CM

## 2021-02-04 HISTORY — DX: COVID-19: U07.1

## 2021-02-19 ENCOUNTER — Telehealth: Payer: Self-pay | Admitting: Allergy

## 2021-02-19 MED ORDER — ALBUTEROL SULFATE HFA 108 (90 BASE) MCG/ACT IN AERS: 2.0000 | INHALATION_SPRAY | RESPIRATORY_TRACT | 1 refills | Status: DC | PRN

## 2021-02-19 NOTE — Telephone Encounter (Signed)
Albuterol refills have been sent to the request pharmacy and patient has been made aware.

## 2021-02-19 NOTE — Telephone Encounter (Signed)
Patient called and needs a refill on her albuterol inhaler sent to [facility]. Patient was diagnosed with COVID on Friday and currently does not have an inhaler.  Please advise.

## 2021-03-08 ENCOUNTER — Ambulatory Visit: Payer: PRIVATE HEALTH INSURANCE | Admitting: Rheumatology

## 2021-04-13 ENCOUNTER — Encounter: Payer: Self-pay | Admitting: Family Medicine

## 2021-04-13 ENCOUNTER — Other Ambulatory Visit: Payer: Self-pay

## 2021-04-13 ENCOUNTER — Ambulatory Visit: Payer: No Typology Code available for payment source | Admitting: Family Medicine

## 2021-04-13 VITALS — BP 108/88 | HR 66 | Temp 98.2°F | Resp 16 | Ht 63.7 in

## 2021-04-13 DIAGNOSIS — J4530 Mild persistent asthma, uncomplicated: Secondary | ICD-10-CM

## 2021-04-13 DIAGNOSIS — Z91038 Other insect allergy status: Secondary | ICD-10-CM

## 2021-04-13 DIAGNOSIS — J45990 Exercise induced bronchospasm: Secondary | ICD-10-CM

## 2021-04-13 DIAGNOSIS — J3089 Other allergic rhinitis: Secondary | ICD-10-CM

## 2021-04-13 DIAGNOSIS — J4531 Mild persistent asthma with (acute) exacerbation: Secondary | ICD-10-CM

## 2021-04-13 DIAGNOSIS — H1013 Acute atopic conjunctivitis, bilateral: Secondary | ICD-10-CM | POA: Diagnosis not present

## 2021-04-13 DIAGNOSIS — K219 Gastro-esophageal reflux disease without esophagitis: Secondary | ICD-10-CM

## 2021-04-13 MED ORDER — OMEPRAZOLE 40 MG PO CPDR: 40.0000 mg | DELAYED_RELEASE_CAPSULE | Freq: Every day | ORAL | 0 refills | Status: DC

## 2021-04-13 NOTE — Patient Instructions (Addendum)
Asthma Continue albuterol 2 puffs once every 4 hours as needed for cough or wheeze You may use albuterol 2 puffs 5 to 15 minutes before activity to decrease cough or wheeze  Allergic rhinitis Continue allergen avoidance measures directed toward dust mite as listed below Continue montelukast 10 mg once a day Restart Dymista 2 sprays in each nostril once a day as needed for nasal symptoms Continue Xyzal 5 mg once a day as needed for runny nose or itch Consider saline nasal rinses as needed for nasal symptoms. Use this before any medicated nasal sprays for best result  Allergic conjunctivitis Continue olopatadine 1 drop in each eye once a day as needed for red or itchy eyes  Reflux Continue dietary and lifestyle modifications as listed below Begin omeprazole 40 mg once a day for a 30 day therapeutic trial to control reflux  Stinging insect allergy Continue to avoid stinging insects.  In case of an allergic reaction, take Benadryl 50 mg  every 4 hours, and if life-threatening symptoms occur, inject with EpiPen 0.3 mg.  Call the clinic if this treatment plan is not working well for you  Follow up in 3 months or sooner if needed.   Control of Dust Mite Allergen Dust mites play a major role in allergic asthma and rhinitis. They occur in environments with high humidity wherever human skin is found. Dust mites absorb humidity from the atmosphere (ie, they do not drink) and feed on organic matter (including shed human and animal skin). Dust mites are a microscopic type of insect that you cannot see with the naked eye. High levels of dust mites have been detected from mattresses, pillows, carpets, upholstered furniture, bed covers, clothes, soft toys and any woven material. The principal allergen of the dust mite is found in its feces. A gram of dust may contain 1,000 mites and 250,000 fecal particles. Mite antigen is easily measured in the air during house cleaning activities. Dust mites do not bite  and do not cause harm to humans, other than by triggering allergies/asthma.  Ways to decrease your exposure to dust mites in your home:  1. Encase mattresses, box springs and pillows with a mite-impermeable barrier or cover  2. Wash sheets, blankets and drapes weekly in hot water (130 F) with detergent and dry them in a dryer on the hot setting.  3. Have the room cleaned frequently with a vacuum cleaner and a damp dust-mop. For carpeting or rugs, vacuuming with a vacuum cleaner equipped with a high-efficiency particulate air (HEPA) filter. The dust mite allergic individual should not be in a room which is being cleaned and should wait 1 hour after cleaning before going into the room.  4. Do not sleep on upholstered furniture (eg, couches).  5. If possible removing carpeting, upholstered furniture and drapery from the home is ideal. Horizontal blinds should be eliminated in the rooms where the person spends the most time (bedroom, study, television room). Washable vinyl, roller-type shades are optimal.  6. Remove all non-washable stuffed toys from the bedroom. Wash stuffed toys weekly like sheets and blankets above.  7. Reduce indoor humidity to less than 50%. Inexpensive humidity monitors can be purchased at most hardware stores. Do not use a humidifier as can make the problem worse and are not recommended. ;

## 2021-04-13 NOTE — Progress Notes (Signed)
[address] [place]a [place]o [place] [address] Dept: [phone]  FOLLOW UP NOTE  Patient ID: Toni Monroe, female    DOB: 06/01/1981  Age: 40 y.o. MRN: [id number] Date of Office Visit: 04/13/2021  Assessment  Chief Complaint: Cough (Since having COVID-19 infection in May 2022)  HPI Toni Monroe is a 40 year old female who presents to the clinic for follow-up visit.  She was last seen in this clinic on 07/06/2020 by Dr. Delorse Lek for evaluation of asthma, allergic rhinitis, allergic conjunctivitis, and allergy to stinging insect.  In the interim, she did have COVID and may for which she took Paxlovid.  At today's visit, she reports that her asthma has not been well controlled since May with symptoms including cough which is reported as sometimes dry and sometimes producing mucus as well as occasional wheeze occurring during the daytime.  She does report that she occasionally gets short of breath with the cough.  She denies shortness of breath with rest or activity.  She has not used her albuterol since her last visit to this clinic.  Allergic rhinitis is reported as moderately well controlled with symptoms including clear rhinorrhea in the morning and postnasal drainage with frequent throat clearing.  She continues montelukast 10 mg once a day and uses Xyzal as needed.  She does report that she experiences the most of her allergic rhinitis symptoms in the fall and spring seasons.  She uses Dymista occasionally with relief of symptoms.  She is not currently using nasal saline rinses as she reports when she uses these it usually leads to illness.  Allergic conjunctivitis is reported as moderately well controlled with symptoms including red and itchy eyes for which she uses olopatadine with relief of symptoms.  She denies symptoms of reflux including heartburn or vomiting and is not currently taking a medication to control reflux.  She has previously taken omeprazole.  She continues to avoid  stinging insects and has not needed to use her EpiPen since her last visit to this clinic.  Her current medications are listed in the chart.   Drug Allergies:  Allergies  Allergen Reactions   Ketoprofen Rash   Other Itching, Dermatitis and Rash    Adhesive    Wound Dressing Adhesive Dermatitis, Itching, Rash and Swelling    Physical Exam: BP 108/88 (BP Location: Left Arm, Patient Position: Sitting, Cuff Size: Large)   Pulse 66   Temp 98.2 F (36.8 C) (Temporal)   Resp 16   Ht 5' 3.7" (1.618 m)   SpO2 99%   BMI 39.85 kg/m    Physical Exam Vitals reviewed.  Constitutional:      Appearance: Normal appearance.  HENT:     Head: Normocephalic and atraumatic.     Right Ear: Tympanic membrane normal.     Left Ear: Tympanic membrane normal.     Nose:     Comments: Bilateral nares slightly erythematous with clear nasal drainage noted.  Pharynx is slightly erythematous with no exudate.  Ears normal.  Eyes normal.    Mouth/Throat:     Pharynx: Oropharynx is clear.  Eyes:     Conjunctiva/sclera: Conjunctivae normal.  Cardiovascular:     Rate and Rhythm: Normal rate and regular rhythm.     Heart sounds: Normal heart sounds. No murmur heard. Pulmonary:     Effort: Pulmonary effort is normal.     Breath sounds: Normal breath sounds.     Comments: Lungs clear to auscultation Musculoskeletal:  General: Normal range of motion.     Cervical back: Normal range of motion and neck supple.  Skin:    General: Skin is warm and dry.  Neurological:     Mental Status: She is alert and oriented to person, place, and time.  Psychiatric:        Mood and Affect: Mood normal.        Behavior: Behavior normal.        Thought Content: Thought content normal.        Judgment: Judgment normal.    Diagnostics: FVC 3.43, FEV1 2.97.  Predicted FVC 3.73, predicted FEV1 3.05.  Spirometry indicates normal ventilatory function.  Assessment and Plan: 1. Mild persistent asthma without  complication   2. Allergic rhinitis due to other allergic trigger, unspecified seasonality   3. Allergic conjunctivitis of both eyes   4. Hymenoptera allergy   5. Gastroesophageal reflux disease, unspecified whether esophagitis present     Meds ordered this encounter  Medications   omeprazole (PRILOSEC) 40 MG capsule    Sig: Take 1 capsule (40 mg total) by mouth daily.    Dispense:  30 capsule    Refill:  0     Patient Instructions  Asthma Continue albuterol 2 puffs once every 4 hours as needed for cough or wheeze You may use albuterol 2 puffs 5 to 15 minutes before activity to decrease cough or wheeze  Allergic rhinitis Continue allergen avoidance measures directed toward dust mite as listed below Continue montelukast 10 mg once a day Restart Dymista 2 sprays in each nostril once a day as needed for nasal symptoms Continue Xyzal 5 mg once a day as needed for runny nose or itch Consider saline nasal rinses as needed for nasal symptoms. Use this before any medicated nasal sprays for best result  Allergic conjunctivitis Continue olopatadine 1 drop in each eye once a day as needed for red or itchy eyes  Reflux Continue dietary and lifestyle modifications as listed below Begin omeprazole 40 mg once a day for a 30 day therapeutic trial to control reflux  Stinging insect allergy Continue to avoid stinging insects.  In case of an allergic reaction, take Benadryl 50 mg  every 4 hours, and if life-threatening symptoms occur, inject with EpiPen 0.3 mg.  Call the clinic if this treatment plan is not working well for you  Follow up in 3 months or sooner if needed.   Return in about 3 months (around 07/14/2021), or if symptoms worsen or fail to improve.    Thank you for the opportunity to care for this patient.  Please do not hesitate to contact me with questions.  Thermon Leyland, FNP [facility]

## 2021-04-14 ENCOUNTER — Encounter: Payer: Self-pay | Admitting: Family Medicine

## 2021-04-14 DIAGNOSIS — K219 Gastro-esophageal reflux disease without esophagitis: Secondary | ICD-10-CM | POA: Insufficient documentation

## 2021-04-14 DIAGNOSIS — H1013 Acute atopic conjunctivitis, bilateral: Secondary | ICD-10-CM | POA: Insufficient documentation

## 2021-05-06 ENCOUNTER — Other Ambulatory Visit: Payer: Self-pay | Admitting: Family Medicine

## 2021-07-05 ENCOUNTER — Other Ambulatory Visit: Payer: Self-pay

## 2021-07-05 ENCOUNTER — Ambulatory Visit: Payer: No Typology Code available for payment source | Admitting: Allergy

## 2021-07-05 ENCOUNTER — Encounter: Payer: Self-pay | Admitting: Allergy

## 2021-07-05 VITALS — BP 126/82 | HR 81 | Temp 98.6°F

## 2021-07-05 DIAGNOSIS — J3089 Other allergic rhinitis: Secondary | ICD-10-CM

## 2021-07-05 DIAGNOSIS — H1013 Acute atopic conjunctivitis, bilateral: Secondary | ICD-10-CM | POA: Diagnosis not present

## 2021-07-05 DIAGNOSIS — Z91038 Other insect allergy status: Secondary | ICD-10-CM

## 2021-07-05 DIAGNOSIS — K219 Gastro-esophageal reflux disease without esophagitis: Secondary | ICD-10-CM

## 2021-07-05 DIAGNOSIS — J45990 Exercise induced bronchospasm: Secondary | ICD-10-CM

## 2021-07-05 DIAGNOSIS — L308 Other specified dermatitis: Secondary | ICD-10-CM

## 2021-07-05 MED ORDER — MONTELUKAST SODIUM 10 MG PO TABS: ORAL_TABLET | ORAL | 11 refills | Status: DC

## 2021-07-05 MED ORDER — ALBUTEROL SULFATE HFA 108 (90 BASE) MCG/ACT IN AERS: 2.0000 | INHALATION_SPRAY | RESPIRATORY_TRACT | 1 refills | Status: DC | PRN

## 2021-07-05 MED ORDER — AZELASTINE-FLUTICASONE 137-50 MCG/ACT NA SUSP: 1.0000 | Freq: Two times a day (BID) | NASAL | 11 refills | Status: DC

## 2021-07-05 MED ORDER — EPINEPHRINE 0.3 MG/0.3ML IJ SOAJ: INTRAMUSCULAR | 3 refills | Status: AC

## 2021-07-05 NOTE — Progress Notes (Signed)
Follow-up Note  RE: Toni Monroe MRN: [id number] DOB: 08-30-1981 Date of Office Visit: 07/05/2021   History of present illness: Toni Monroe is a 40 y.o. female presenting today for follow-up of asthma, allergic rhinitis with conjunctivitis, and sting insect allergy.  She was last seen in the office on 04/25/2021 by our nurse practitioner Ambs.   She does feel like the dymista has helped with decreasing her nasal drainage.  However does feel like her nose gets dry.  Not able to tolerate nasal saline spray.  She does take  Zyrtec daily and will use Xyzal as needed during heavy pollen season like fall.  She also continues to take singulair daily.    She did stop omeprazole when her prescription ran out.   She does note that PPIs are not best for chronic long-term use.  She does have pepcid at home that she can use for reflux control.    She did use albuterol once this week for what she believes was an anxiety.  She states she felt some tightness in the back of her chest.  She went to sleep shortly after use and states when she woke up she was not anxious.  Otherwise states last time she use albuterol prior was when she had Covid in May.      She states the hand dermatitis she has had has returned.  She also is noting redness of her ears.  She did have fluocinonide and use on her hands for 2 days and feels like it made it worse.    Review of systems: Review of Systems  Constitutional: Negative.   HENT:         See HPI  Eyes: Negative.   Respiratory:         See HPI  Cardiovascular: Negative.   Gastrointestinal: Negative.   Musculoskeletal: Negative.   Skin:  Positive for itching and rash.  Neurological: Negative.    All other systems negative unless noted above in HPI  Past medical/social/surgical/family history have been reviewed and are unchanged unless specifically indicated below.  No changes  Medication List: Current Outpatient Medications  Medication Sig  Dispense Refill   albuterol (VENTOLIN HFA) 108 (90 Base) MCG/ACT inhaler Inhale 2 puffs into the lungs every 4 (four) hours as needed (for cough or wheeze). 18 g 1   Azelastine-Fluticasone (DYMISTA) 137-50 MCG/ACT SUSP Place 1 spray into both nostrils 2 (two) times daily. 23 g 11   B Complex Vitamins (B COMPLEX PO) Take by mouth daily.     cetirizine (ZYRTEC) 10 MG tablet Take 10 mg by mouth daily.     clonazePAM (KLONOPIN) 0.5 MG disintegrating tablet TAKE 1 TABLET (0.5 MG TOTAL) BY MOUTH NIGHTLY AS NEEDED FOR UP TO 30 DAYS     diphenhydrAMINE (BENADRYL) 25 mg capsule Take by mouth as needed.     diphenhydramine-acetaminophen (TYLENOL PM) 25-500 MG TABS tablet Take 1 tablet by mouth at bedtime as needed.     Drospirenone (SLYND) 4 MG TABS      EPINEPHrine 0.3 mg/0.3 mL IJ SOAJ injection Use for life threatening allergic reactions 4 each 3   hydrochlorothiazide (HYDRODIURIL) 25 MG tablet Take 25 mg by mouth daily.     ibuprofen (ADVIL,MOTRIN) 800 MG tablet Take 1 tablet (800 mg total) by mouth every 8 (eight) hours as needed. 30 tablet 1   Levocetirizine Dihydrochloride (XYZAL ALLERGY 24HR PO) as needed.     metoprolol succinate (TOPROL-XL) 25 MG 24 hr tablet  Take 1 tablet by mouth daily.     montelukast (SINGULAIR) 10 MG tablet TAKE 1 TABLET EACH EVENING TO PREVENT COUGH OR WHEEZE. 90 tablet 11   Olopatadine HCl 0.2 % SOLN Apply 1 drop to eye daily as needed. 2.5 mL 5   omeprazole (PRILOSEC) 40 MG capsule TAKE 1 CAPSULE (40 MG TOTAL) BY MOUTH DAILY. **INS NOT CONTRACTED W[facility] 30 capsule 0   traZODone (DESYREL) 50 MG tablet Take 50 mg by mouth as needed.     VITAMIN D PO Take by mouth daily.     No current facility-administered medications for this visit.     Known medication allergies: Allergies  Allergen Reactions   Ketoprofen Rash   Other Itching, Dermatitis and Rash    Adhesive    Wound Dressing Adhesive Dermatitis, Itching, Rash and Swelling     Physical examination: Blood  pressure 126/82, pulse 81, temperature 98.6 F (37 C), temperature source Temporal, SpO2 96 %.  General: Alert, interactive, in no acute distress. HEENT: PERRLA, TMs pearly gray, turbinates minimally edematous without discharge, post-pharynx non erythematous. Neck: Supple without lymphadenopathy. Lungs: Clear to auscultation without wheezing, rhonchi or rales. {no increased work of breathing. CV: Normal S1, S2 without murmurs. Abdomen: Nondistended, nontender. Skin: Erythematous rough patch on left lateral 5th digit and b/l thumbs; erythematous ear pinna b/l . Extremities:  No clubbing, cyanosis or edema. Neuro:   Grossly intact.  Diagnositics/Labs: None today  Assessment and plan:   Asthma Continue albuterol 2 puffs once every 4 hours as needed for cough or wheeze You may use albuterol 2 puffs 5 to 15 minutes before activity to decrease cough or wheeze  Allergic rhinitis Continue allergen avoidance measures directed toward dust mite as listed below Continue montelukast 10 mg once a day Continue Dymista 1 sprays in each nostril 1-2 times a day as needed for nasal congestion/drainage Can use vaseline applied to the nostril for moisturization Continue Zyrtec daily and can add dose of Xyzal if needed for additional symptom control  Allergic conjunctivitis Continue olopatadine 1 drop in each eye once a day as needed for red or itchy eyes  Reflux Continue dietary and lifestyle modifications as listed below Recommend use of Pepcid 20mg  1-2 times a day for reflux control.  This is a histamine-2 blocker and better for longer term reflux control  Stinging insect allergy Continue to avoid stinging insects.  In case of an allergic reaction, take Benadryl 50 mg  every 4 hours, and if life-threatening symptoms occur, inject with EpiPen 0.3 mg.  Eczematous dermatitis Trial Opzelura twice a day application for itchy, patchy, bumpy, dry, flaky, scaly, red/irritated areas until improved.  This  is a non-steroid ointment that can be used anywhere on body if needed.  If effective let know and we can send in prescription  Follow up in 6-12 months or sooner if needed  I appreciate the opportunity to take part in [place] care. Please do not hesitate to contact me with questions.  Sincerely,   [place], MD Allergy/Immunology [facility]

## 2021-07-05 NOTE — Patient Instructions (Addendum)
Asthma Continue albuterol 2 puffs once every 4 hours as needed for cough or wheeze You may use albuterol 2 puffs 5 to 15 minutes before activity to decrease cough or wheeze  Allergic rhinitis Continue allergen avoidance measures directed toward dust mite as listed below Continue montelukast 10 mg once a day Continue Dymista 1 sprays in each nostril 1-2 times a day as needed for nasal congestion/drainage Can use vaseline applied to the nostril for moisturization Continue Zyrtec daily and can add dose of Xyzal if needed for additional symptom control  Allergic conjunctivitis Continue olopatadine 1 drop in each eye once a day as needed for red or itchy eyes  Reflux Continue dietary and lifestyle modifications as listed below Recommend use of Pepcid 20mg  1-2 times a day for reflux control.  This is a histamine-2 blocker and better for longer term reflux control  Stinging insect allergy Continue to avoid stinging insects.  In case of an allergic reaction, take Benadryl 50 mg  every 4 hours, and if life-threatening symptoms occur, inject with EpiPen 0.3 mg.  Eczematous dermatitis Trial Opzelura twice a day application for itchy, patchy, bumpy, dry, flaky, scaly, red/irritated areas until improved.  This is a non-steroid ointment that can be used anywhere on body if needed.  If effective let know and we can send in prescription  Follow up in 6-12 months or sooner if needed

## 2021-07-10 ENCOUNTER — Other Ambulatory Visit: Payer: Self-pay

## 2021-07-10 MED ORDER — OPZELURA 1.5 % EX CREA: 1.0000 "application " | TOPICAL_CREAM | Freq: Two times a day (BID) | CUTANEOUS | 5 refills | Status: DC | PRN

## 2022-01-17 DIAGNOSIS — S39012A Strain of muscle, fascia and tendon of lower back, initial encounter: Secondary | ICD-10-CM | POA: Insufficient documentation

## 2022-02-25 ENCOUNTER — Telehealth: Payer: Self-pay

## 2022-02-25 MED ORDER — OLOPATADINE HCL 0.2 % OP SOLN: 1.0000 [drp] | Freq: Every day | OPHTHALMIC | 0 refills | Status: DC | PRN

## 2022-02-25 NOTE — Telephone Encounter (Signed)
Patient called in - DOB refille - request medication refill on Olopatadine. Patient has scheduled f/u ov 04/04/22 @ 3 pm.  Electronically sent in medication refill to [facility].

## 2022-04-04 ENCOUNTER — Ambulatory Visit: Payer: No Typology Code available for payment source | Admitting: Family Medicine

## 2022-04-12 ENCOUNTER — Encounter: Payer: Self-pay | Admitting: Internal Medicine

## 2022-04-12 ENCOUNTER — Ambulatory Visit (INDEPENDENT_AMBULATORY_CARE_PROVIDER_SITE_OTHER): Payer: PRIVATE HEALTH INSURANCE | Admitting: Internal Medicine

## 2022-04-12 VITALS — BP 128/88 | HR 81 | Ht 63.7 in

## 2022-04-12 DIAGNOSIS — J4520 Mild intermittent asthma, uncomplicated: Secondary | ICD-10-CM

## 2022-04-12 DIAGNOSIS — L232 Allergic contact dermatitis due to cosmetics: Secondary | ICD-10-CM

## 2022-04-12 DIAGNOSIS — J3089 Other allergic rhinitis: Secondary | ICD-10-CM | POA: Diagnosis not present

## 2022-04-12 DIAGNOSIS — T782XXD Anaphylactic shock, unspecified, subsequent encounter: Secondary | ICD-10-CM

## 2022-04-12 DIAGNOSIS — H1013 Acute atopic conjunctivitis, bilateral: Secondary | ICD-10-CM | POA: Diagnosis not present

## 2022-04-12 DIAGNOSIS — K219 Gastro-esophageal reflux disease without esophagitis: Secondary | ICD-10-CM

## 2022-04-12 DIAGNOSIS — H1045 Other chronic allergic conjunctivitis: Secondary | ICD-10-CM

## 2022-04-12 MED ORDER — OLOPATADINE HCL 0.2 % OP SOLN
1.0000 [drp] | Freq: Every day | OPHTHALMIC | 5 refills | Status: AC | PRN
Start: 2022-04-12 — End: ?

## 2022-04-12 NOTE — Progress Notes (Signed)
Follow Up Note  RE: Toni Monroe MRN: [id number] DOB: [birth year] Date of Office Visit: 04/12/2022  Referring provider: Rafael Bihari, * Primary care provider: Punger, Demetrio Lapping, MD  Chief Complaint: Follow-up (Refill meds)  History of Present Illness: I had the pleasure of seeing [name] for a follow up visit at the [facility] on 04/12/2022. She is a 41 y.o. female, who is being followed for allergic rhinitis, mild intermittent asthma, eczema, stinging insect allergy and GERD. Her previous allergy office visit was on 07/05/2021 with Dr. Delorse Lek. Today is a regular follow up visit.  History obtained from patient .  ASTHMA - Medical therapy: no controller therapy  - Rescue inhaler use: none since last visit  - Symptoms: denies cough, wheeze, dyspnea  - Exacerbation history: 0 ABX for respiratory illness since last visit, 0 OCS, 0ED, 0 UC visits in the past year   -She had one upper respiratory infection which  - ACT: 25 /25 - Adverse effects of medication: denies  - Biologic Labs none indicated    Rhinitis:  Using dymista which controls nasal symptoms well.  Requests refills of olopatadine eye drops which she needs 3-4 times a week for itchy watery eyes.  She also takes Montelukast 10mg  daily and zyrtec as needed     GERD:  switched from pepcid to prilosec for better control of GERD.  She has been seen by GI for symptoms.  She understands she should not be on PPIs longterm.      Stinging Insect Allergy  - denies any field stings or epipen use   Dermatitis  - hand dermatitis resolved when she started using acylate free nail products.  She now strictly avoids acrylates and has had no recurrence    Assessment and Plan: [name] is a 41 y.o. 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 Plan: Patient Instructions  Asthma Continue albuterol 2 puffs once every 4 hours as needed for cough or wheeze You may use albuterol 2 puffs 5 to 15 minutes before activity to decrease cough or wheeze  Allergic rhinitis Continue allergen avoidance measures directed toward dust mite Continue montelukast 10mg  daily  Continue Dymista 1 sprays in each nostril 1-2 times a day as needed for nasal congestion/drainage Can use vaseline applied to the nostril for moisturization Continue Zyrtec daily as needed and can add dose of Xyzal if needed for additional symptom control  Allergic conjunctivitis Continue olopatadine 1 drop in each eye once a day as needed for red or itchy eyes  Reflux Continue dietary and lifestyle modifications as listed below Continue Prilosec daily for control  Follow up with GI if symptoms are not well controlled   Stinging insect allergy Continue to avoid stinging insects.  In case of an allergic reaction, take Benadryl 50 mg  every 4 hours, and if life-threatening symptoms occur, inject with EpiPen 0.3 mg.  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  Don't hesitate to reach out if you have any additional concerns!  41, MD  [facility] [place] [place]   Follow up in 6-12 months or sooner if neededNo follow-ups on file.  Meds ordered this encounter  Medications   Olopatadine HCl 0.2 % SOLN    Sig: Apply 1 drop to eye daily as needed.    Dispense:  2.5 mL    Refill:  5    Please dispense generic    Lab Orders  No laboratory test(s) ordered today   Diagnostics: None performed    Medication List:  Current Outpatient Medications  Medication Sig Dispense Refill   albuterol (VENTOLIN HFA) 108 (90 Base)  MCG/ACT inhaler Inhale 2 puffs into the lungs every 4 (four) hours as needed (for cough or wheeze). 18 g 1   Azelastine-Fluticasone (DYMISTA) 137-50 MCG/ACT SUSP Place 1 spray into both nostrils 2 (two) times daily. 23 g 11   B Complex Vitamins (B COMPLEX PO) Take by mouth daily.     BORIC ACID VAGINAL VA boric acid 600mg  supp  INSERT ONE CAPSULE VAGINALLY FOR 10 DAYS THEN INSERT ONE CAPSULE 2-3 TIME A WEEK FOR THREE MONTHS     cetirizine (ZYRTEC) 10 MG tablet Take 10 mg by mouth daily.     diphenhydrAMINE (BENADRYL) 25 mg capsule Take by mouth as needed.     diphenhydramine-acetaminophen (TYLENOL PM) 25-500 MG TABS tablet Take 1 tablet by mouth at bedtime as needed.     EPINEPHrine 0.3 mg/0.3 mL IJ SOAJ injection Use for life threatening allergic reactions 4 each 3   hydrochlorothiazide (HYDRODIURIL) 25 MG tablet Take 25 mg by mouth daily.     ibuprofen (ADVIL,MOTRIN) 800 MG tablet Take 1 tablet (800 mg total) by mouth every 8 (eight) hours as needed. 30 tablet 1   metoprolol succinate (TOPROL-XL) 25 MG 24 hr tablet Take 1 tablet by mouth daily.     montelukast (SINGULAIR) 10 MG tablet TAKE 1 TABLET EACH EVENING TO PREVENT COUGH OR WHEEZE. 90 tablet 11   Norethin Ace-Eth Estrad-FE (LOESTRIN 24 FE PO) Take by mouth.     omeprazole (PRILOSEC) 40 MG capsule TAKE 1 CAPSULE (40 MG TOTAL) BY MOUTH DAILY. **INS NOT CONTRACTED W/[facility] 30 capsule 0   rosuvastatin (CRESTOR) 5 MG tablet Take 5 mg by mouth daily.     Ruxolitinib Phosphate (OPZELURA) 1.5 % CREA Apply 1 application topically 2 (two) times daily as needed. 60 g 5   VITAMIN D PO Take by mouth daily.     Olopatadine HCl 0.2 % SOLN Apply 1 drop to eye daily as needed. 2.5 mL 5   No current facility-administered medications for this visit.   Allergies: Allergies  Allergen Reactions   Glycerin Itching    Other reaction(s): Unknown   Acrylate Copolymer Itching and Rash   Ketoprofen Rash   Other Itching, Dermatitis and Rash    Adhesive     Wound Dressing Adhesive Dermatitis, Itching, Rash and Swelling   I reviewed her past medical history, social history, family history, and environmental history and no significant changes have been reported from her previous visit.  ROS: All others negative except as noted per HPI.   Objective: BP 128/88   Pulse 81   Ht 5' 3.7" (1.618 m)   SpO2 98%   BMI 39.85 kg/m  Body mass index is 39.85 kg/m. General Appearance:  Alert, cooperative, no distress, appears stated age  Head:  Normocephalic, without obvious abnormality, atraumatic  Eyes:  Conjunctiva clear, EOM's intact  Nose: Nares normal,  erythematous nasal mucosa, no visible anterior polyps, and septum midline  Throat: Lips, tongue normal; teeth and gums normal, + cobblestoning  Neck: Supple, symmetrical  Lungs:   clear to auscultation  bilaterally, Respirations unlabored, no coughing  Heart:  regular rate and rhythm and no murmur, Appears well perfused  Extremities: No edema  Skin: Skin color, texture, turgor normal, no rashes or lesions on visualized portions of skin  Neurologic: No gross deficits   Previous notes and tests were reviewed. The plan was reviewed with the patient/family, and all questions/concerned were addressed.  It was my pleasure to see Orianna today and participate in her care. Please feel free to contact me with any questions or concerns.  Sincerely,  Ferol Luz, MD  Allergy & Immunology  [facility] Office: [phone]

## 2022-04-12 NOTE — Patient Instructions (Addendum)
Asthma Continue albuterol 2 puffs once every 4 hours as needed for cough or wheeze You may use albuterol 2 puffs 5 to 15 minutes before activity to decrease cough or wheeze  Allergic rhinitis Continue allergen avoidance measures directed toward dust mite Continue montelukast 10mg  daily  Continue Dymista 1 sprays in each nostril 1-2 times a day as needed for nasal congestion/drainage Can use vaseline applied to the nostril for moisturization Continue Zyrtec daily as needed and can add dose of Xyzal if needed for additional symptom control  Allergic conjunctivitis Continue olopatadine 1 drop in each eye once a day as needed for red or itchy eyes  Reflux Continue dietary and lifestyle modifications as listed below Continue Prilosec daily for control  Follow up with GI if symptoms are not well controlled   Stinging insect allergy Continue to avoid stinging insects.  In case of an allergic reaction, take Benadryl 50 mg  every 4 hours, and if life-threatening symptoms occur, inject with EpiPen 0.3 mg.  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  Don't hesitate to reach out if you have any additional concerns!  , MD  [facility] [place] [place]   Follow up in 6-12 months or sooner if needed

## 2022-07-30 ENCOUNTER — Other Ambulatory Visit: Payer: Self-pay | Admitting: Allergy

## 2022-07-30 DIAGNOSIS — J3089 Other allergic rhinitis: Secondary | ICD-10-CM

## 2023-05-09 ENCOUNTER — Telehealth: Payer: Self-pay | Admitting: Allergy

## 2023-05-09 DIAGNOSIS — J3089 Other allergic rhinitis: Secondary | ICD-10-CM

## 2023-05-09 MED ORDER — MONTELUKAST SODIUM 10 MG PO TABS: ORAL_TABLET | ORAL | 0 refills | Status: DC

## 2023-05-09 NOTE — Telephone Encounter (Signed)
Patient emailed a form that has to be signed by Dr Delorse Lek for Montelukast to be filled by [facility]. Form was placed in Nurses bin.

## 2023-05-09 NOTE — Telephone Encounter (Signed)
Prescription has been sent to the [facility] per the forms request. I left a detailed message informing patient that prescription has been sent in and to be sure to keep her appointment on 06/18/2023 at 8:30 for further refills.

## 2023-06-18 ENCOUNTER — Other Ambulatory Visit: Payer: Self-pay

## 2023-06-18 ENCOUNTER — Ambulatory Visit: Payer: BC Managed Care – PPO | Admitting: Allergy

## 2023-06-18 ENCOUNTER — Encounter: Payer: Self-pay | Admitting: Allergy

## 2023-06-18 VITALS — BP 128/72 | HR 71 | Temp 98.3°F | Resp 17

## 2023-06-18 DIAGNOSIS — J3089 Other allergic rhinitis: Secondary | ICD-10-CM | POA: Diagnosis not present

## 2023-06-18 DIAGNOSIS — K219 Gastro-esophageal reflux disease without esophagitis: Secondary | ICD-10-CM

## 2023-06-18 DIAGNOSIS — J4520 Mild intermittent asthma, uncomplicated: Secondary | ICD-10-CM

## 2023-06-18 DIAGNOSIS — H1045 Other chronic allergic conjunctivitis: Secondary | ICD-10-CM | POA: Diagnosis not present

## 2023-06-18 DIAGNOSIS — Z91038 Other insect allergy status: Secondary | ICD-10-CM

## 2023-06-18 DIAGNOSIS — L232 Allergic contact dermatitis due to cosmetics: Secondary | ICD-10-CM

## 2023-06-18 MED ORDER — MONTELUKAST SODIUM 10 MG PO TABS: ORAL_TABLET | ORAL | 3 refills | Status: DC

## 2023-06-18 MED ORDER — OPZELURA 1.5 % EX CREA: 1.0000 "application " | TOPICAL_CREAM | Freq: Two times a day (BID) | CUTANEOUS | 5 refills | Status: DC | PRN

## 2023-06-18 MED ORDER — RYALTRIS 665-25 MCG/ACT NA SUSP: 2.0000 | Freq: Two times a day (BID) | NASAL | 5 refills | Status: DC | PRN

## 2023-06-18 MED ORDER — ALBUTEROL SULFATE HFA 108 (90 BASE) MCG/ACT IN AERS: 2.0000 | INHALATION_SPRAY | RESPIRATORY_TRACT | 1 refills | Status: DC | PRN

## 2023-06-18 NOTE — Patient Instructions (Addendum)
Asthma - no issues in past year Continue albuterol 2 puffs once every 4 hours as needed for cough or wheeze You may use albuterol 2 puffs 5 to 15 minutes before activity to decrease cough or wheeze  Allergic rhinitis Continue allergen avoidance measures Continue montelukast 10mg  daily  Change to Ryaltris nasal spray 2 sprays in each nostril 1-2 times a day as needed for nasal congestion/drainage.  This is a combination nasal spray similar to Dymista.  Sample provided.   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  In case of an allergic reaction, take Benadryl 50 mg  every 4 hours, and if life-threatening symptoms occur, inject with EpiPen 0.3 mg.  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

## 2023-06-18 NOTE — Addendum Note (Signed)
Addended by: Dub Mikes on: 06/18/2023 02:52 PM   Modules accepted: Orders

## 2023-06-18 NOTE — Progress Notes (Signed)
Follow-up Note  RE: Toni Monroe MRN: [id number] DOB: 1981-07-10 Date of Office Visit: 06/18/2023   History of present illness: Toni Monroe is a 42 y.o. female presenting today for follow-up of asthma, allergic rhinitis with conjunctivitis, hymenoptera allergy, eczematous dermatitis and reflux.  She was last seen in the office on 04/12/2022 by Dr. Marlynn Perking.  She has not had any major health changes, surgeries or hospitalizations in the past year. She states this year she has not noted any worsening allergy symptoms than any other year.  She does use zyrtec daily and will add in xyzal as needed for increased allergy symptoms.  The dymista works well for her but insurance is no longer covering it at this time.  She continues on singulair daily as well.  Has not had a lot of need for eye drop this year.   Her asthma has been very quiet over past year and has not had any need for albuterol use.  No ED/UC visits or systemic steroid needs in past year.  She has not had any stings this year or need to use her epinephrine device.  She states she did need to use nail polish at some point in past year and she did develop rash related to this but states the opzulara ointment does well when she needed.   She uses pepcid daily for reflux control and no longer taking PPI.  She is trying to do more dietary changes like drinking alkaline water and taking ginger tea which does help with reflux control.    Review of systems: 10pt ROS negative unless noted above in HPI  Past medical/social/surgical/family history have been reviewed and are unchanged unless specifically indicated below.  No changes  Medication List: Current Outpatient Medications  Medication Sig Dispense Refill   B Complex Vitamins (B COMPLEX PO) Take by mouth daily.     cetirizine (ZYRTEC) 10 MG tablet Take 10 mg by mouth daily.     diphenhydrAMINE (BENADRYL) 25 mg capsule Take by mouth as needed.      diphenhydramine-acetaminophen (TYLENOL PM) 25-500 MG TABS tablet Take 1 tablet by mouth at bedtime as needed.     EPINEPHrine 0.3 mg/0.3 mL IJ SOAJ injection Use for life threatening allergic reactions 4 each 3   hydrochlorothiazide (HYDRODIURIL) 25 MG tablet Take 25 mg by mouth daily.     ibuprofen (ADVIL,MOTRIN) 800 MG tablet Take 1 tablet (800 mg total) by mouth every 8 (eight) hours as needed. 30 tablet 1   metoprolol succinate (TOPROL-XL) 25 MG 24 hr tablet Take 1 tablet by mouth daily.     montelukast (SINGULAIR) 10 MG tablet TAKE 1 TABLET EACH EVENING TO PREVENT COUGH OR WHEEZE. 90 tablet 0   Norethin Ace-Eth Estrad-FE (LOESTRIN 24 FE PO) Take by mouth.     Olopatadine HCl 0.2 % SOLN Apply 1 drop to eye daily as needed. 2.5 mL 5   Olopatadine-Mometasone (RYALTRIS) 665-25 MCG/ACT SUSP Place 2 sprays into the nose 2 (two) times daily as needed. 29 g 5   rosuvastatin (CRESTOR) 5 MG tablet Take 5 mg by mouth daily.     VITAMIN D PO Take by mouth daily.     albuterol (VENTOLIN HFA) 108 (90 Base) MCG/ACT inhaler Inhale 2 puffs into the lungs every 4 (four) hours as needed (for cough or wheeze). 18 g 1   Ruxolitinib Phosphate (OPZELURA) 1.5 % CREA Apply 1 application  topically 2 (two) times daily as needed. 60 g 5  No current facility-administered medications for this visit.     Known medication allergies: Allergies  Allergen Reactions   Glycerin Itching    Other reaction(s): Unknown   Acrylate Copolymer Itching and Rash   Ketoprofen Rash   Other Itching, Dermatitis and Rash    Adhesive    Wound Dressing Adhesive Dermatitis, Itching, Rash and Swelling     Physical examination: Blood pressure 128/72, pulse 71, temperature 98.3 F (36.8 C), temperature source Temporal, resp. rate 17, SpO2 96%.  General: Alert, interactive, in no acute distress. HEENT: PERRLA, TMs pearly gray, turbinates minimally edematous without discharge, post-pharynx non erythematous. Neck: Supple without  lymphadenopathy. Lungs: Clear to auscultation without wheezing, rhonchi or rales. {no increased work of breathing. CV: Normal S1, S2 without murmurs. Abdomen: Nondistended, nontender. Skin: Warm and dry, without lesions or rashes. Extremities:  No clubbing, cyanosis or edema. Neuro:   Grossly intact.  Diagnositics/Labs: None today  Assessment and plan:   Asthma - no issues in past year Continue albuterol 2 puffs once every 4 hours as needed for cough or wheeze You may use albuterol 2 puffs 5 to 15 minutes before activity to decrease cough or wheeze  Allergic rhinitis Continue allergen avoidance measures Continue montelukast 10mg  daily  Change to Ryaltris nasal spray 2 sprays in each nostril 1-2 times a day as needed for nasal congestion/drainage.  This is a combination nasal spray similar to Dymista.  Sample provided.   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  In case of an allergic reaction, take Benadryl 50 mg  every 4 hours, and if life-threatening symptoms occur, inject with EpiPen 0.3 mg.  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 Please do not hesitate to contact me with questions.  Sincerely,   Margo Aye, MD Allergy/Immunology [facility]

## 2024-01-15 ENCOUNTER — Ambulatory Visit: Admitting: Family Medicine

## 2024-01-15 ENCOUNTER — Encounter: Payer: Self-pay | Admitting: Family Medicine

## 2024-01-15 VITALS — BP 132/92 | Ht 64.0 in | Wt 210.0 lb

## 2024-01-15 DIAGNOSIS — M25511 Pain in right shoulder: Secondary | ICD-10-CM | POA: Diagnosis not present

## 2024-01-15 DIAGNOSIS — M7551 Bursitis of right shoulder: Secondary | ICD-10-CM

## 2024-01-15 MED ORDER — METHYLPREDNISOLONE ACETATE 40 MG/ML IJ SUSP
40.0000 mg | Freq: Once | INTRAMUSCULAR | Status: AC
  Administered 2024-01-15: 40 mg via INTRA_ARTICULAR

## 2024-01-15 NOTE — Progress Notes (Signed)
   PCP: Punger, Demetrio Lapping, MD  SUBJECTIVE:   HPI:  Patient is a 43 y.o. female here with chief complaint of right shoulder pain.   Pain ongoing now for 2 weeks. Denies any injury at the onset. Initially was posterior but now is wrapping anterolateral. No paresthesias or radicular pain. No weakness. Pain with overhead activity and + night pain. No previous shoulder injuries or surgeries. 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 Has been taking 800mg  Ibuprofen with modest benefit yet pain returns. No previous shoulder imaging.   Pertinent ROS were reviewed with the patient and found to be negative unless otherwise specified above in HPI.   PERTINENT  PMH / PSH / FH / SH:  Past Medical, Surgical, Social, and Family History Reviewed & Updated in the EMR.  Pertinent findings include:  Hypermobility d/o Allergies  Allergen Reactions   Glycerin Itching    Other reaction(s): Unknown   Acrylate Copolymer Itching and Rash   Ketoprofen Rash   Other Itching, Dermatitis and Rash    Adhesive    Wound Dressing Adhesive Dermatitis, Itching, Rash and Swelling    OBJECTIVE:  BP (!) 132/92   Ht 5\' 4"  (1.626 m)   Wt 210 lb (95.3 kg)   BMI 36.05 kg/m   PHYSICAL EXAM:  GEN: Alert and Oriented, NAD, comfortable in exam room RESP: Unlabored respirations, symmetric chest rise PSY: normal mood, congruent affect   RIGHT SHOULDER MSK EXAM: No swelling, ecchymoses.  No gross deformity. Mildly TTP posterolaterally, no TTP in the bicipital groove or AC joint. FROM. Positive Hawkins, negative Neers. Negative Yergasons. Strength 5/5 with empty can and resisted internal/external rotation, mild discomfort with empty can testing. Negative apprehension. NV intact distally. Assessment & Plan Subacromial bursitis of right shoulder joint Exam most c/w impingement of the right shoulder. Cuff strength is full so low suspicion for  significant cuff tear.   Plan: -Discussed diagnosis and tx options including PO NSAIDS vs cortisone injection and she elected on the latter. See below for procedure details. -Recommend few days of relative rest avoiding lifting overhead followed by rotator cuff HEP focused on resistance with IR/ER and Jobes exercises. -No need for imaging at this time, though if pain persists or worsens will plan for shoulder U/S at follow-up -Follow-up in 4-6 weeks.  Right Subacromial Injection Procedure: After informed written consent timeout was performed, patient was in seated position on exam table.  Right shoulder was prepped with alcohol swab x2. Ethyl chloride spray used for topical anesthetic. Utilizing the posterior approach and a 25g needle, the right subacromial bursa was injected with 4:1 lidocaine:depomedrol.  Following the injection a bandage was applied to the area. Patient tolerated procedure well without immediate complications. The patient was counseled as to the expected post-injection course, including the possibility of worsening of pain with steroid flare. Instructed as to concerning symptoms and advised to contact the office if these should arise.    Glean Salen, MD PGY-4, Sports Medicine Fellow [facility]

## 2024-01-15 NOTE — Patient Instructions (Addendum)
 You have subacromial bursitis. Do some gentle arm swings for the next few days until the steroid injection kicks in, then start the shoulder rehab exercises provided today. Goal to do these ~5 times weekly. Let's follow-up in 4-6 weeks. ------------------------ Today you received an injection with a corticosteroid (aka: cortisone injection). This injection is usually done in response to pain and inflammation. There is some "numbing medicine" (Lidocaine) in the shot, so the injected area may be numb and feel really good for the next couple of hours. The numbing medicine usually wears off in 2-3 hours, and then your pain level may be back to where it was before the injection until the cortisone starts working.    The actually benefit from the steroid injection is usually noticed within 3-5 days, but may take up to 14 days. You may actually experience a small (as in 10%) INCREASE in pain in the first 24 hours---that is common.  Things to watch out for that you should contact us or a health care provider urgently would include: 1. Unusual (as in more than 10%) increase in pain 2. New fever > 101.5 3. New swelling or redness of the injected area. 4. Streaking of red lines around the area injected.  Do not hesitate to call or reach out with any questions or concerns.

## 2024-02-03 ENCOUNTER — Encounter: Payer: Self-pay | Admitting: Allergy

## 2024-02-19 ENCOUNTER — Ambulatory Visit: Admitting: Family Medicine

## 2024-02-26 ENCOUNTER — Ambulatory Visit: Admitting: Family Medicine

## 2024-02-26 ENCOUNTER — Encounter: Payer: Self-pay | Admitting: Family Medicine

## 2024-02-26 VITALS — BP 131/96 | Ht 64.0 in | Wt 220.0 lb

## 2024-02-26 DIAGNOSIS — M7551 Bursitis of right shoulder: Secondary | ICD-10-CM

## 2024-02-26 NOTE — Progress Notes (Signed)
   PCP: Punger, Blenda Burdock, MD  SUBJECTIVE:   HPI:  Patient is a 43 y.o. female here for follow-up of right shoulder pain.   She reports her pain is down from an 8/10 to a 2/10 with certain activities such as overhead lifting.  She recently has been traveling for work and states that it was intermittently uncomfortable though is definitely improved. Hasn't been as diligent as she would have liked d/t travel. The injection took about 7 days to kick in for her.  She denies any radicular symptoms and has no new complaints.  Pertinent ROS were reviewed with the patient and found to be negative unless otherwise specified above in HPI.   PERTINENT  PMH / PSH / FH / SH:  Past Medical, Surgical, Social, and Family History Reviewed & Updated in the EMR.  Pertinent findings include:  Hypermobility disorder  Allergies  Allergen Reactions   Glycerin Itching    Other reaction(s): Unknown   Acrylate Copolymer Itching and Rash   Ketoprofen  Rash   Other Itching, Dermatitis and Rash    Adhesive    Wound Dressing Adhesive Dermatitis, Itching, Rash and Swelling    OBJECTIVE:  BP (!) 131/96   Ht 5\' 4"  (1.626 m)   Wt 220 lb (99.8 kg)   BMI 37.76 kg/m   PHYSICAL EXAM:  GEN: Alert and Oriented, NAD, comfortable in exam room RESP: Unlabored respirations, symmetric chest rise PSY: normal mood, congruent affect   RIGHT SHOULDER MSK EXAM: No swelling, ecchymoses.  No gross deformity. Mildly TTP over lateral shoulder FROM. Mildly + Hawkins, negative Neers. Negative Yergasons. Strength 5/5 with empty can and resisted internal/external rotation, minimal discomfort with empty can testing. NV intact distally.  Assessment & Plan Subacromial bursitis of right shoulder joint Improved subacromial bursitis with some mild lingering discomfort though this isn't concerning given her exam and rotator cuff integrity.   Plan: - Recommended continuation of HEP provided last visit focused on resistance  with IR/ER and Jobes exercises with a goal of 5-7 days per week over next 6-8 weeks. - PRN NSAIDs - Follow-up if not resolved in 6-8 weeks and can consider repeat CSI vs NG patches. Patient's questions were answered and they are in agreement with this plan.  Lin Rend, MD PGY-4, Sports Medicine Fellow [facility]

## 2024-02-26 NOTE — Patient Instructions (Signed)
 We saw you for follow-up of right shoulder subacromial bursitis.  I am pleased with your progress thus far and believe this should continue to improve with regular home exercises we reviewed last visit. Try to do 3 sets of 10 of these 5-7 days per week for the next 6-8 weeks - then try to do it 2-3 days per week after that. If you are still having pain with the shoulder in 6-8 weeks we should see you back, but otherwise you can follow-up just as needed.

## 2024-07-30 ENCOUNTER — Encounter: Payer: Self-pay | Admitting: Allergy

## 2024-08-02 MED ORDER — MONTELUKAST SODIUM 10 MG PO TABS: ORAL_TABLET | ORAL | 0 refills | Status: DC

## 2024-08-04 NOTE — Progress Notes (Signed)
 Subjective Toni Monroe is a 43 y.o. female who presents for Follow-up (HTN) History of Present Illness The patient presents for a regular follow-up.  Grief - She reports improved ability to rise from bed, especially on weekends. - She engages in household activities and travels extensively for work. - She has resumed her work schedule fully. - She has bi-weekly sessions with a counselor.  Hypertension and Allergies - She is on metoprolol 25 mg daily for hypertension. - She takes allergy medication year-round due to dust allergy. - She also has allergies to certain skin products.  COVID-19 Recovery - She contracted COVID-19 last month but has recovered. - Postnasal drip and cough have largely resolved.  Itching and Estradiol  Cream - She has not started using estradiol  cream prescribed for significant itching. - Her physician suggested this treatment to determine if the itching is due to a skin condition or estrogen issue. - She plans to start the treatment soon but wants to ensure she will be at home for three consecutive weeks before doing so.  FAMILY HISTORY Aunt has severe allergies.  Objective Blood pressure 111/83, pulse 73, temperature 97.3 F (36.3 C), temperature source Temporal, height 1.626 m (5' 4). Physical Exam Constitutional:      General: She is not in acute distress.    Appearance: Normal appearance. She is not ill-appearing.  Cardiovascular:     Rate and Rhythm: Normal rate and regular rhythm.     Pulses: Normal pulses.     Heart sounds: Normal heart sounds.  Pulmonary:     Effort: Pulmonary effort is normal.     Breath sounds: Normal breath sounds. No wheezing, rhonchi or rales.  Musculoskeletal:     Right lower leg: No edema.     Left lower leg: No edema.  Neurological:     Mental Status: She is alert.       Assessment & Plan 1. Hypertension: - Blood pressure readings are within normal range. - Current regimen of hydrochlorothiazide  and metoprolol 25 mg daily is effective. - Lab work today to monitor electrolytes due to hydrochlorothiazide.  2. Allergic rhinitis: - Allergic to dust and mold, exacerbates symptoms year-round. - Continue current allergy medication.  3. Recent COVID-19 infection: - Had COVID-19 last month, almost fully recovered, minor residual postnasal drip and cough.  4. Itching: - Significant itching. - Advised to use estradiol  cream to determine if skin or estrogen issue. Plans to start soon but wants controlled environment due to skin allergies.  5. Health Maintenance: - Received flu shot this season. - Last tetanus shot in 2017, due in a couple of years.

## 2024-10-14 ENCOUNTER — Other Ambulatory Visit: Payer: Self-pay

## 2024-10-14 ENCOUNTER — Ambulatory Visit: Admitting: Allergy

## 2024-10-14 ENCOUNTER — Encounter: Payer: Self-pay | Admitting: Allergy

## 2024-10-14 VITALS — BP 132/76 | HR 72 | Temp 97.8°F

## 2024-10-14 DIAGNOSIS — H1045 Other chronic allergic conjunctivitis: Secondary | ICD-10-CM

## 2024-10-14 DIAGNOSIS — J4520 Mild intermittent asthma, uncomplicated: Secondary | ICD-10-CM

## 2024-10-14 DIAGNOSIS — L232 Allergic contact dermatitis due to cosmetics: Secondary | ICD-10-CM | POA: Diagnosis not present

## 2024-10-14 DIAGNOSIS — J3089 Other allergic rhinitis: Secondary | ICD-10-CM

## 2024-10-14 DIAGNOSIS — Z91038 Other insect allergy status: Secondary | ICD-10-CM

## 2024-10-14 DIAGNOSIS — K219 Gastro-esophageal reflux disease without esophagitis: Secondary | ICD-10-CM | POA: Diagnosis not present

## 2024-10-14 MED ORDER — MONTELUKAST SODIUM 10 MG PO TABS: ORAL_TABLET | ORAL | 3 refills | Status: DC

## 2024-10-14 MED ORDER — LEVOCETIRIZINE DIHYDROCHLORIDE 5 MG PO TABS: 5.0000 mg | ORAL_TABLET | ORAL | 3 refills | Status: AC | PRN

## 2024-10-14 MED ORDER — CETIRIZINE HCL 10 MG PO TABS: 10.0000 mg | ORAL_TABLET | Freq: Every day | ORAL | 3 refills | Status: AC

## 2024-10-14 MED ORDER — OPZELURA 1.5 % EX CREA: 1.0000 "application " | TOPICAL_CREAM | Freq: Two times a day (BID) | CUTANEOUS | 5 refills | Status: AC | PRN

## 2024-10-14 MED ORDER — RYALTRIS 665-25 MCG/ACT NA SUSP: 2.0000 | Freq: Two times a day (BID) | NASAL | 5 refills | Status: AC | PRN

## 2024-10-14 MED ORDER — ALBUTEROL SULFATE HFA 108 (90 BASE) MCG/ACT IN AERS: 2.0000 | INHALATION_SPRAY | RESPIRATORY_TRACT | 1 refills | Status: AC | PRN

## 2024-10-14 MED ORDER — NEFFY 2 MG/0.1ML NA SOLN: 1.0000 | NASAL | 1 refills | Status: AC | PRN

## 2024-10-14 NOTE — Progress Notes (Signed)
 "   Follow-up Note  RE: Toni Monroe MRN: [id number] DOB: 1981-04-06 Date of Office Visit: 10/14/2024   History of present illness: Toni Monroe is a 44 y.o. female presenting today for follow-up of asthma, allergic rhinitis with conjunctivitis, hymenoptera allergy, eczematous dermatitis, reflux.  She was last seen in the office on 06/18/2023 by myself. Discussed the use of AI scribe software for clinical note transcription with the patient, who gave verbal consent to proceed.  She has not needed to use her rescue inhaler since her last visit in September 2024, despite traveling internationally for work and having COVID-19 in September 2025 while on a plane to the [place] but has not experienced any other respiratory illnesses in the past year. She notes some seasonal allergy symptoms, particularly in the spring, which cause mild breathing limitations in the morning but are not very concerning to her.  Her allergy symptoms are exacerbated by environmental allergens, including pollen and outdoor mold. She has been using montelukast  (Singulair ) and nasal sprays, including Dymista  mainly and Ryaltris  (has a sample she takes while traveling as it is not in a glass bottle), to manage her symptoms. She reports increased use of her nasal spray due to outdoor mold and dust exposure, which are significant allergens for her. She takes Zyrtec  daily and has not needed to add in Xyzal  recently.  Her reflux symptoms have improved since her mother's passing, which reduced her stress levels. She continues to take Pepcid once a day and has not used Nexium recently.  She has been using Opzelura  for skin rashes, which she finds effective. She experienced a rash after using a benzoyl peroxide acne cream and stopped using it after a severe breakout. She is cautious about applying creams near her eyes.     Review of systems: 10pt ROS negative unless noted above in HPI   Past  medical/social/surgical/family history have been reviewed and are unchanged unless specifically indicated below.  No changes  Medication List: Current Outpatient Medications  Medication Sig Dispense Refill   Azelastine -Fluticasone  137-50 MCG/ACT SUSP Place 1 spray into the nose daily.     B Complex Vitamins (B COMPLEX PO) Take by mouth daily.     diphenhydrAMINE (BENADRYL) 25 mg capsule Take by mouth as needed.     diphenhydramine-acetaminophen (TYLENOL PM) 25-500 MG TABS tablet Take 1 tablet by mouth at bedtime as needed.     EPINEPHrine  (NEFFY ) 2 MG/0.1ML SOLN Place 1 spray into the nose as needed (Anaphylaxis). 6 each 1   EPINEPHrine  0.3 mg/0.3 mL IJ SOAJ injection Use for life threatening allergic reactions (Patient taking differently: Inject 0.3 mg into the muscle as needed. Use for life threatening allergic reactions) 4 each 3   escitalopram (LEXAPRO) 5 MG tablet Take 5 mg by mouth daily.     hydrochlorothiazide (HYDRODIURIL) 25 MG tablet Take 25 mg by mouth daily.     ibuprofen  (ADVIL ,MOTRIN ) 800 MG tablet Take 1 tablet (800 mg total) by mouth every 8 (eight) hours as needed. 30 tablet 1   metoprolol succinate (TOPROL-XL) 25 MG 24 hr tablet Take 1 tablet by mouth daily.     Norethin  Ace-Eth Estrad-FE (LOESTRIN 24 FE PO) Take by mouth.     Olopatadine  HCl 0.2 % SOLN Apply 1 drop to eye daily as needed. 2.5 mL 5   omeprazole  (PRILOSEC) 20 MG capsule Take 20 mg by mouth daily.     rosuvastatin (CRESTOR) 5 MG tablet Take 5 mg by mouth daily.  VITAMIN D  PO Take by mouth daily.     albuterol  (VENTOLIN  HFA) 108 (90 Base) MCG/ACT inhaler Inhale 2 puffs into the lungs every 4 (four) hours as needed (for cough or wheeze). 18 g 1   cetirizine  (ZYRTEC ) 10 MG tablet Take 1 tablet (10 mg total) by mouth daily. 90 tablet 3   estradiol  (ESTRACE ) 0.01 % CREA vaginal cream Place 1 Applicatorful vaginally as needed.     levocetirizine (XYZAL ) 5 MG tablet Take 1 tablet (5 mg total) by mouth as needed.  90 tablet 3   montelukast  (SINGULAIR ) 10 MG tablet TAKE 1 TABLET EACH EVENING TO PREVENT COUGH OR WHEEZE. 90 tablet 3   Olopatadine -Mometasone (RYALTRIS ) 665-25 MCG/ACT SUSP Place 2 sprays into the nose 2 (two) times daily as needed. 29 g 5   Ruxolitinib Phosphate  (OPZELURA ) 1.5 % CREA Apply 1 application  topically 2 (two) times daily as needed. 60 g 5   No current facility-administered medications for this visit.     Known medication allergies: Allergies[1]   Physical examination: Blood pressure 132/76, pulse 72, temperature 97.8 F (36.6 C), SpO2 96%.  General: Alert, interactive, in no acute distress. HEENT: PERRLA, TMs pearly gray, turbinates non-edematous without discharge, post-pharynx non erythematous. Neck: Supple without lymphadenopathy. Lungs: Clear to auscultation without wheezing, rhonchi or rales. {no increased work of breathing. CV: Normal S1, S2 without murmurs. Abdomen: Nondistended, nontender. Skin: Warm and dry, without lesions or rashes. Extremities:  No clubbing, cyanosis or edema. Neuro:   Grossly intact.  Diagnostics/Labs: None today  Assessment and plan:   Asthma, under good control Continue albuterol  2 puffs once every 4 hours as needed for cough or wheeze You may use albuterol  2 puffs 5 to 15 minutes before activity to decrease cough or wheeze  Allergic rhinitis Continue allergen avoidance measures Continue montelukast  10mg  daily  Continue Dymista  1 spray each nostril twice a day or Ryaltris  nasal spray 2 sprays in each nostril twice a day as needed for nasal congestion/drainage.   Can use vaseline applied to the nostril for moisturization With using nasal sprays point tip of bottle toward eye on same side nostril and lean head slightly forward for best technique.   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     Provided with samples of Vtama, non-steroid ointment, to use during travels as needed for rash  Follow up: 12 months or sooner if needed   I appreciate the opportunity to take part in [place] care. Please do not hesitate to contact me with questions.  Sincerely,   Danita Brain, MD Allergy/Immunology [facility]       [1]  Allergies Allergen Reactions   Glycerin Itching    Other reaction(s): Unknown   Acrylate Copolymer Itching and Rash   Ketoprofen  Rash   Other Itching, Dermatitis and Rash    Adhesive  Wound Dressing Adhesive Dermatitis, Itching, Rash and Swelling   "

## 2024-10-14 NOTE — Patient Instructions (Addendum)
 Asthma, under good control Continue albuterol  2 puffs once every 4 hours as needed for cough or wheeze You may use albuterol  2 puffs 5 to 15 minutes before activity to decrease cough or wheeze  Allergic rhinitis Continue allergen avoidance measures Continue montelukast  10mg  daily  Continue Dymista  1 spray each nostril twice a day or Ryaltris  nasal spray 2 sprays in each nostril twice a day as needed for nasal congestion/drainage.   Can use vaseline applied to the nostril for moisturization With using nasal sprays point tip of bottle toward eye on same side nostril and lean head slightly forward for best technique.   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     Provided with samples of Vtama, non-steroid ointment, to use during travels as needed for rash  Follow up: 12 months or sooner if needed

## 2024-10-16 ENCOUNTER — Encounter: Payer: Self-pay | Admitting: Allergy

## 2024-10-16 DIAGNOSIS — J4520 Mild intermittent asthma, uncomplicated: Secondary | ICD-10-CM

## 2024-10-18 MED ORDER — MONTELUKAST SODIUM 10 MG PO TABS: ORAL_TABLET | ORAL | 3 refills | Status: AC

## 2024-10-18 MED ORDER — MONTELUKAST SODIUM 10 MG PO TABS: ORAL_TABLET | ORAL | 3 refills | Status: DC

## 2024-10-25 ENCOUNTER — Telehealth: Payer: Self-pay

## 2024-10-25 NOTE — Telephone Encounter (Signed)
*  AA  Pharmacy Patient Advocate Encounter   Received notification from Fax that prior authorization for Opzelura  1.5% is required/requested.   Insurance verification completed.   The patient is insured through [facility].   Per test claim: PA required; PA started via CoverMyMeds. KEY [id number] . Please see clinical question(s) below that I am not finding the answer to in their chart and advise.   Has the patient tried and failed at least one topical calcineurin inhibitor (e.g., tacrolimus ointment, etc)?

## 2024-10-26 NOTE — Telephone Encounter (Signed)
 Called and spoke with the patient and informed of medication not being covered. She stated that she has a lot of Opzelura  right now and will call back when she is running low so that we can send in an alternative for her.
